# Patient Record
Sex: Male | Born: 1980 | Race: Black or African American | Hispanic: No | Marital: Married
Health system: Southern US, Community
[De-identification: ages and names within clinical notes are randomized; demographics above are authoritative.]

## PROBLEM LIST (undated history)

## (undated) DIAGNOSIS — F4310 Post-traumatic stress disorder, unspecified: Secondary | ICD-10-CM

---

## 2009-01-08 ENCOUNTER — Emergency Department (HOSPITAL_COMMUNITY): Admission: EM | Admit: 2009-01-08 | Discharge: 2009-01-08 | Payer: Self-pay | Admitting: Emergency Medicine

## 2010-05-07 IMAGING — CR DG FOOT COMPLETE 3+V*L*
3 series · 3 of 3 positions shown · non-contrast
Comparison: None

CLINICAL DATA: Left rib injury.  Question foreign body.

LEFT FOOT - COMPLETE 3+ VIEW

[t foot ap left]
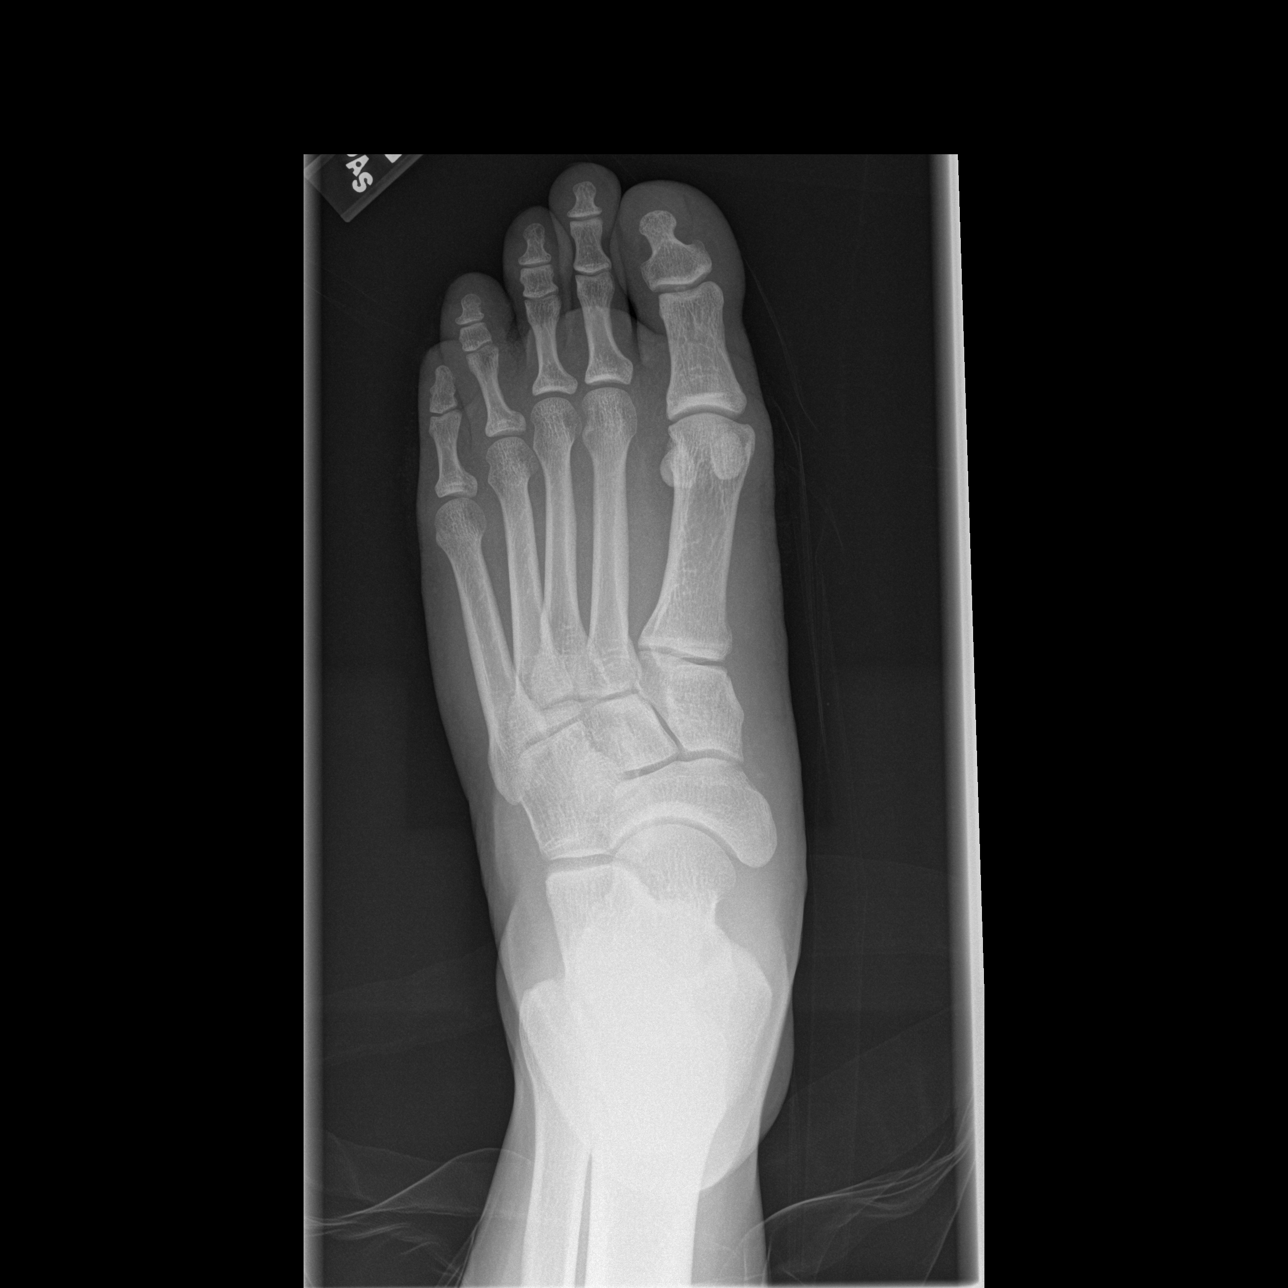

[t foot oblique left]
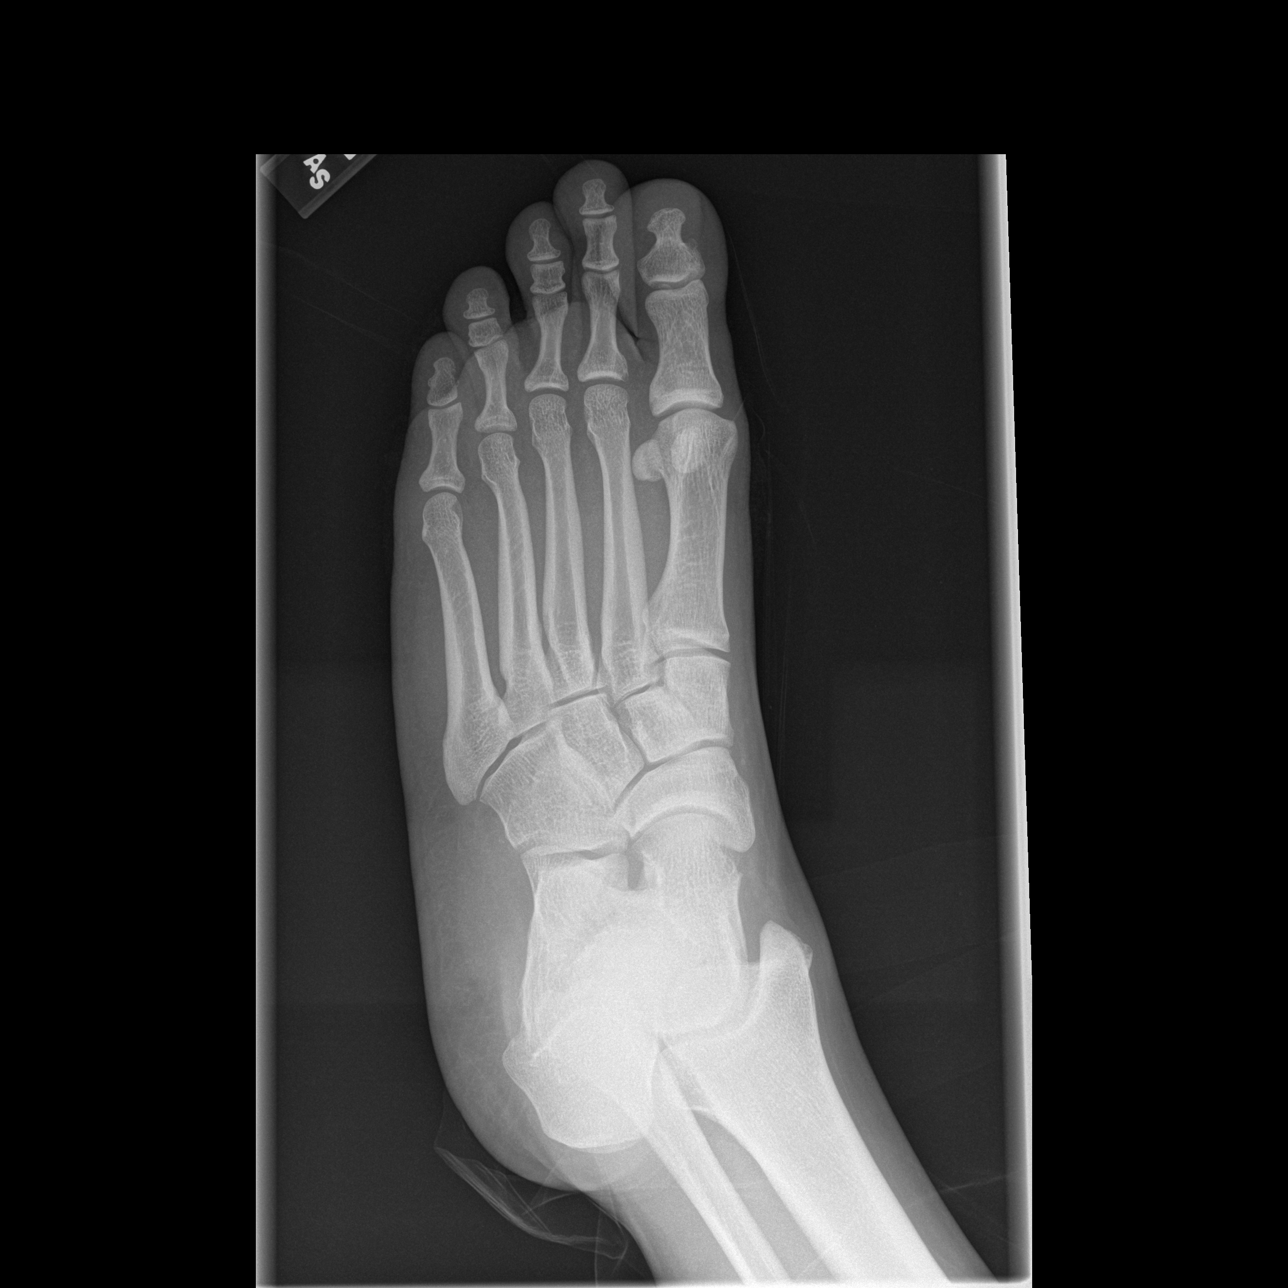

[t foot lat left]
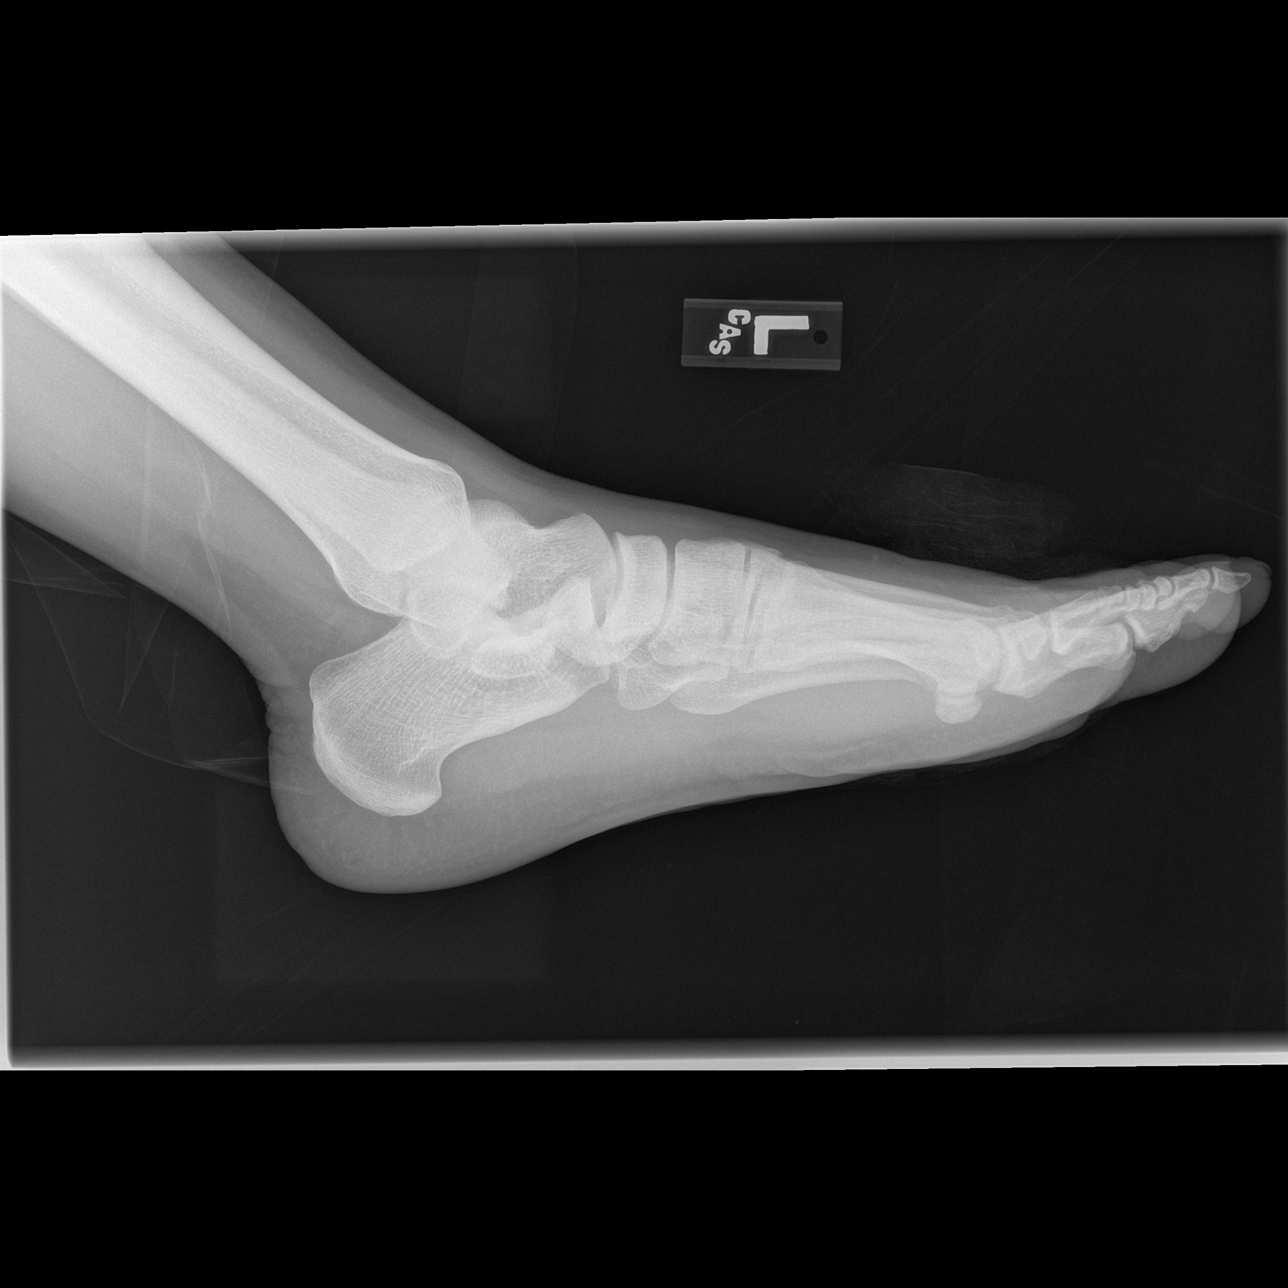

[3 of 3 positions shown; findings below may reference images not displayed]

FINDINGS: No radiopaque foreign body or soft tissue gas.  No
fracture.
IMPRESSION: Negative for fracture or radiopaque foreign body.

REF:G1 DICTATED: 01/08/2009 [DATE]

## 2016-04-10 ENCOUNTER — Inpatient Hospital Stay (HOSPITAL_COMMUNITY)
Admission: AD | Admit: 2016-04-10 | Discharge: 2016-04-15 | DRG: 885 | Disposition: A | Discharge status: Home / Self Care | Payer: No Typology Code available for payment source | Source: Intra-hospital | Attending: Psychiatry | Admitting: Psychiatry

## 2016-04-10 ENCOUNTER — Emergency Department (HOSPITAL_COMMUNITY)
Admission: EM | Admit: 2016-04-10 | Discharge: 2016-04-10 | Disposition: A | Discharge status: Home / Self Care | Payer: Non-veteran care | Attending: Emergency Medicine | Admitting: Emergency Medicine

## 2016-04-10 ENCOUNTER — Encounter (HOSPITAL_COMMUNITY): Payer: Self-pay | Admitting: Emergency Medicine

## 2016-04-10 ENCOUNTER — Encounter (HOSPITAL_COMMUNITY): Payer: Self-pay

## 2016-04-10 DIAGNOSIS — Z915 Personal history of self-harm: Secondary | ICD-10-CM

## 2016-04-10 DIAGNOSIS — Z9119 Patient's noncompliance with other medical treatment and regimen: Secondary | ICD-10-CM | POA: Diagnosis not present

## 2016-04-10 DIAGNOSIS — F17200 Nicotine dependence, unspecified, uncomplicated: Secondary | ICD-10-CM | POA: Diagnosis present

## 2016-04-10 DIAGNOSIS — Z791 Long term (current) use of non-steroidal anti-inflammatories (NSAID): Secondary | ICD-10-CM | POA: Diagnosis not present

## 2016-04-10 DIAGNOSIS — F4312 Post-traumatic stress disorder, chronic: Secondary | ICD-10-CM | POA: Diagnosis not present

## 2016-04-10 DIAGNOSIS — Z79899 Other long term (current) drug therapy: Secondary | ICD-10-CM | POA: Diagnosis not present

## 2016-04-10 DIAGNOSIS — G4733 Obstructive sleep apnea (adult) (pediatric): Secondary | ICD-10-CM | POA: Diagnosis not present

## 2016-04-10 DIAGNOSIS — F1220 Cannabis dependence, uncomplicated: Secondary | ICD-10-CM | POA: Insufficient documentation

## 2016-04-10 DIAGNOSIS — F4310 Post-traumatic stress disorder, unspecified: Secondary | ICD-10-CM | POA: Diagnosis not present

## 2016-04-10 DIAGNOSIS — F250 Schizoaffective disorder, bipolar type: Secondary | ICD-10-CM | POA: Diagnosis present

## 2016-04-10 DIAGNOSIS — R45851 Suicidal ideations: Secondary | ICD-10-CM | POA: Diagnosis present

## 2016-04-10 DIAGNOSIS — F332 Major depressive disorder, recurrent severe without psychotic features: Secondary | ICD-10-CM | POA: Diagnosis present

## 2016-04-10 DIAGNOSIS — F12259 Cannabis dependence with psychotic disorder, unspecified: Secondary | ICD-10-CM | POA: Diagnosis present

## 2016-04-10 DIAGNOSIS — F333 Major depressive disorder, recurrent, severe with psychotic symptoms: Secondary | ICD-10-CM | POA: Diagnosis present

## 2016-04-10 DIAGNOSIS — R45850 Homicidal ideations: Secondary | ICD-10-CM | POA: Diagnosis present

## 2016-04-10 HISTORY — DX: Post-traumatic stress disorder, unspecified: F43.10

## 2016-04-10 LAB — COMPREHENSIVE METABOLIC PANEL
ALBUMIN: 4.2 g/dL (ref 3.5–5.0)
ALT: 22 U/L (ref 17–63)
AST: 21 U/L (ref 15–41)
Alkaline Phosphatase: 67 U/L (ref 38–126)
Anion gap: 7 (ref 5–15)
BUN: 13 mg/dL (ref 6–20)
CHLORIDE: 106 mmol/L (ref 101–111)
CO2: 26 mmol/L (ref 22–32)
CREATININE: 0.92 mg/dL (ref 0.61–1.24)
Calcium: 9.3 mg/dL (ref 8.9–10.3)
GFR calc Af Amer: >60 mL/min (ref >60)
GLUCOSE: 109 mg/dL — AB (ref 65–99)
Potassium: 4 mmol/L (ref 3.5–5.1)
SODIUM: 139 mmol/L (ref 135–145)
Total Bilirubin: 0.2 mg/dL — ABNORMAL LOW (ref 0.3–1.2)
Total Protein: 7.8 g/dL (ref 6.5–8.1)

## 2016-04-10 LAB — CBC
HEMATOCRIT: 44.8 % (ref 39.0–52.0)
HEMOGLOBIN: 15.3 g/dL (ref 13.0–17.0)
MCH: 31.9 pg (ref 26.0–34.0)
MCHC: 34.2 g/dL (ref 30.0–36.0)
MCV: 93.5 fL (ref 78.0–100.0)
Platelets: 209 10*3/uL (ref 150–400)
RBC: 4.79 MIL/uL (ref 4.22–5.81)
RDW: 14 % (ref 11.5–15.5)
WBC: 6.7 10*3/uL (ref 4.0–10.5)

## 2016-04-10 LAB — ETHANOL: Alcohol, Ethyl (B): <5 mg/dL (ref <5)

## 2016-04-10 LAB — RAPID URINE DRUG SCREEN, HOSP PERFORMED
AMPHETAMINES: NOT DETECTED
BENZODIAZEPINES: NOT DETECTED
Barbiturates: NOT DETECTED
Cocaine: NOT DETECTED
Opiates: NOT DETECTED
TETRAHYDROCANNABINOL: POSITIVE — AB

## 2016-04-10 LAB — ACETAMINOPHEN LEVEL: Acetaminophen (Tylenol), Serum: <10 ug/mL — ABNORMAL LOW (ref 10–30)

## 2016-04-10 LAB — SALICYLATE LEVEL: Salicylate Lvl: <4 mg/dL (ref 2.8–30.0)

## 2016-04-10 MED ORDER — NICOTINE 21 MG/24HR TD PT24
21.0000 mg | MEDICATED_PATCH | Freq: Every day | TRANSDERMAL | Status: DC
  Administered 2016-04-11 – 2016-04-13 (×3): 21 mg via TRANSDERMAL
  Filled 2016-04-10 (×7): qty 1

## 2016-04-10 MED ORDER — LORAZEPAM 1 MG PO TABS: 0.0000 mg | ORAL_TABLET | Freq: Two times a day (BID) | ORAL | Status: DC

## 2016-04-10 MED ORDER — ALUM & MAG HYDROXIDE-SIMETH 200-200-20 MG/5ML PO SUSP: 30.0000 mL | ORAL | Status: DC | PRN

## 2016-04-10 MED ORDER — ACETAMINOPHEN 325 MG PO TABS: 650.0000 mg | ORAL_TABLET | ORAL | Status: DC | PRN

## 2016-04-10 MED ORDER — CLONIDINE HCL 0.1 MG PO TABS
0.1000 mg | ORAL_TABLET | Freq: Three times a day (TID) | ORAL | Status: DC | PRN
  Administered 2016-04-10 – 2016-04-14 (×5): 0.1 mg via ORAL
  Filled 2016-04-10 (×6): qty 1

## 2016-04-10 MED ORDER — MAGNESIUM HYDROXIDE 400 MG/5ML PO SUSP: 30.0000 mL | Freq: Every day | ORAL | Status: DC | PRN

## 2016-04-10 MED ORDER — LORAZEPAM 1 MG PO TABS
0.0000 mg | ORAL_TABLET | Freq: Four times a day (QID) | ORAL | Status: DC
  Administered 2016-04-10 (×2): 1 mg via ORAL
  Filled 2016-04-10 (×2): qty 1

## 2016-04-10 MED ORDER — SERTRALINE HCL 100 MG PO TABS: 100.0000 mg | ORAL_TABLET | Freq: Every day | ORAL | Status: DC | PRN

## 2016-04-10 MED ORDER — ALUM & MAG HYDROXIDE-SIMETH 200-200-20 MG/5ML PO SUSP
30.0000 mL | ORAL | Status: DC | PRN
  Administered 2016-04-15: 30 mL via ORAL
  Filled 2016-04-10: qty 30

## 2016-04-10 MED ORDER — ACETAMINOPHEN 325 MG PO TABS: 650.0000 mg | ORAL_TABLET | Freq: Four times a day (QID) | ORAL | Status: DC | PRN

## 2016-04-10 MED ORDER — ONDANSETRON HCL 4 MG PO TABS: 4.0000 mg | ORAL_TABLET | Freq: Three times a day (TID) | ORAL | Status: DC | PRN

## 2016-04-10 MED ORDER — NICOTINE 21 MG/24HR TD PT24
21.0000 mg | MEDICATED_PATCH | Freq: Every day | TRANSDERMAL | Status: DC
  Administered 2016-04-10: 21 mg via TRANSDERMAL
  Filled 2016-04-10: qty 1

## 2016-04-10 MED ORDER — ZOLPIDEM TARTRATE 5 MG PO TABS: 5.0000 mg | ORAL_TABLET | Freq: Every evening | ORAL | Status: DC | PRN

## 2016-04-10 MED ORDER — SERTRALINE HCL 50 MG PO TABS: 100.0000 mg | ORAL_TABLET | Freq: Every day | ORAL | Status: DC | PRN

## 2016-04-10 NOTE — Tx Team (Signed)
Initial Interdisciplinary Treatment Plan   PATIENT STRESSORS: Medication change or noncompliance Traumatic event   PATIENT STRENGTHS: Capable of independent living Communication skills Supportive family/friends   PROBLEM LIST: Problem List/Patient Goals Date to be addressed Date deferred Reason deferred Estimated date of resolution  Depression 04/10/16     Psychosis 04/10/16     Suicidal ideation 04/10/16     "Better understanding of myself" 04/10/16     "To get out of here" 04/10/16                              DISCHARGE CRITERIA:  Improved stabilization in mood, thinking, and/or behavior Verbal commitment to aftercare and medication compliance  PRELIMINARY DISCHARGE PLAN: Outpatient therapy Medication management  PATIENT/FAMIILY INVOLVEMENT: This treatment plan has been presented to and reviewed with the patient, Robert Kelly.  The patient and family have been given the opportunity to ask questions and make suggestions.  Norm ParcelHeather V Rodrigus Kilker 04/10/2016, 4:09 PM

## 2016-04-10 NOTE — ED Notes (Signed)
Pt admitted to the [facility] with reports of auditory and visual hallucinations. Voices tell patient to hurt himself and others. He sees himself stabbing and dismembering with vivid details. Pt is a veteran with the [employer] and has a hx of PTSD, bipolar, anxiety and depression. He sees himself hanging and walking towards an edge. He says that he drinks and uses marijuana occasionally. Pt has a medical hx of sleep apnea and uses a CPAP at night. Pt contracts for safety.

## 2016-04-10 NOTE — Progress Notes (Signed)
Robert Kelly is a 35 year old male being admitted voluntarily to [facility] from [facility].  [facility].  He reports vivid visions of hanging himself and walking towards the edge of a cliff.  He is unable to contract for safety.  He has history of multiple suicide attempts in the past.  He reports hopelessness, guilt and anhedonia.  He admits to hearing voices that tell him to harm other people.  He denies drug and alcohol abuse.  He has PTSD, Bipolar, Anxiety, Depression and sleep apnea.  Admission paperwork completed and signed.  Belongings searched and secured in locker # 41(black cell phone).  Skin assessment completed and noted tattoos R/L shoulder and R/L forearm.  Q 15 minute checks initiated for safety.  We will monitor the progress towards his goals.

## 2016-04-10 NOTE — BH Assessment (Signed)
BHH Assessment Progress Note   Per Thedore MinsMojeed Akintayo, MD, this pt requires psychiatric hospitalization at this time.  At 09:47 this Clinical research associatewriter [facility] and spoke to VarnaJoey.  He reports that they do not have any beds available at this time, but will place pt on their wait list.  Berneice Heinrichina Tate, RN, [facility] has [facility] to Rm 503[facility]2.  Pt has signed Voluntary Admission and Consent for Treatment, as well as Consent to Release Information to his mother, his wife, and his representatives at the [facility] transitional housing team, and signed [facility].  Pt's nurse, Jan, has been notified, and agrees to send original paperwork along with pt via [facility], and to call report to [phone].  Doylene Canninghomas Analena Gama, MA Triage Specialist [phone]

## 2016-04-10 NOTE — ED Provider Notes (Signed)
CSN: [id number]     Arrival date & time 04/10/16  [id number] History   First MD Initiated Contact with Patient 04/10/16 [phone]     Chief Complaint  Patient presents with  . Suicidal      HPI  Expand All Collapse All   Pt reports SI and HI that began this am. Thoughts worsened by PTSD. No specific plan. Used marijuana and etoh yesterday        Past Medical History  Diagnosis Date  . PTSD (post-traumatic stress disorder)    History reviewed. No pertinent past surgical history. Family History  Problem Relation Age of Onset  . Diabetes Mother   . Alcohol abuse Father   . Alcohol abuse Paternal Grandfather    Social History  Substance Use Topics  . Smoking status: Current Every Day Smoker  . Smokeless tobacco: None  . Alcohol Use: Yes    Review of Systems  Neurological: Negative for headaches.  Psychiatric/Behavioral: Positive for suicidal ideas and agitation.  All other systems reviewed and are negative.     Allergies  Shellfish allergy  Home Medications   Prior to Admission medications   Medication Sig Start Date End Date Taking? Authorizing Provider  meloxicam (MOBIC) 15 MG tablet Take 15 mg by mouth daily.   Yes Historical Provider, MD  sertraline (ZOLOFT) 100 MG tablet Take 100 mg by mouth daily as needed (for depression).   Yes Historical Provider, MD   BP 155/98 mmHg  Pulse 63  Temp(Src) 98.1 F (36.7 C) (Oral)  Resp 16  Ht 5\' 9"  (1.753 m)  Wt 280 lb (127.007 kg)  BMI 41.33 kg/m2  SpO2 99% Physical Exam  Constitutional: He is oriented to person, place, and time. He appears well-developed and well-nourished. No distress.  HENT:  Head: Normocephalic and atraumatic.  Eyes: Pupils are equal, round, and reactive to light.  Neck: Normal range of motion.  Cardiovascular: Normal rate and intact distal pulses.   Pulmonary/Chest: No respiratory distress.  Abdominal: Normal appearance. He exhibits no distension.  Musculoskeletal: Normal range of motion.   Neurological: He is alert and oriented to person, place, and time. No cranial nerve deficit.  Skin: Skin is warm and dry. No rash noted.  Psychiatric: He has a normal mood and affect. His behavior is normal. He expresses homicidal and suicidal ideation. He expresses no suicidal plans.  Nursing note and vitals reviewed.   ED Course  Procedures (including critical care time) Labs Review Labs Reviewed  COMPREHENSIVE METABOLIC PANEL - Abnormal; Notable for the following:    Glucose, Bld 109 (*)    Total Bilirubin 0.2 (*)    All other components within normal limits  ACETAMINOPHEN LEVEL - Abnormal; Notable for the following:    Acetaminophen (Tylenol), Serum <10 (*)    All other components within normal limits  URINE RAPID DRUG SCREEN, HOSP PERFORMED - Abnormal; Notable for the following:    Tetrahydrocannabinol POSITIVE (*)    All other components within normal limits  ETHANOL  SALICYLATE LEVEL  CBC    Imaging Review No results found. I have personally reviewed and evaluated these images and lab results as part of my medical decision-making.   EKG Interpretation None      MDM   Final diagnoses:  Schizoaffective disorder, bipolar type (HCC)  Cannabis use disorder, severe, dependence (HCC)        Nelva Nayobert Alfonsa Vaile, MD 04/15/16 [phone]

## 2016-04-10 NOTE — BH Assessment (Addendum)
Assessment Note  Robert Kelly is an 35 y.o. male with history of PTSD. Patient presents to [facility] with [facility]. Patient is however voluntary. He has complaints of suicidal ideations increasing over the past month. He has vivid visions of hanging self and walking toward the edge of a cliff. Patient is unable to contract for safety. He has a history multiple prior suicide attempts (examples: hanging self and jumping off a bridge). No self mutilating behaviors. Patient suffers from depression and anxiety. He has a history of depression with symptoms of hopelessness, guilt, and loss of interest usual pleasures. Patient's affect is flat. Mood is depressed. Patient is oriented to person, place, time, and situation.   Patient has homicidal thoughts triggered by [facility]. Patient hears voices telling him to, "Hurt people". Patient sts, "I've been hearing voices all my life". Patient has a wife and 8 children, 6 children live with him. Patient requesting hospitalization stating, "I don't feel safe hearing the voices and having my kids at home with me". He denies history of violence or asaultive behaviors. He has current legal charges for child support and obtaining property under false pretense (court dates 04/12/2016 & 05/10/2016).    Patient denies alcohol and drug use. Patient's UDS was however positive for THC. BAL is negative. Patient denies history of INPT mental health treatment. He does report seeking outpatient mental health treatment at the [facility].    Diagnosis: Major Depressive Disorder, Recurrent, Severe, with psychotic features, PTSD, Cannabis Abuse  Past Medical History:  Past Medical History  Diagnosis Date  . PTSD (post-traumatic stress disorder)     History reviewed. No pertinent past surgical history.  Family History: History reviewed. No pertinent family history.  Social History:  reports that he has been smoking.  He does not have any smokeless tobacco history on file. He  reports that he drinks alcohol. He reports that he uses illicit drugs (Marijuana).  Additional Social History:  Alcohol / Drug Use Pain Medications: SEE MAR Prescriptions: SEE MAR Over the Counter: SEE MAR History of alcohol / drug use?: Yes (THC) Substance #1 Name of Substance 1: Patient denied alcohol an drug use; UDS positive for THC 1 - Age of First Use: n/a 1 - Amount (size/oz): n/a 1 - Frequency: n/a 1 - Duration: n/a 1 - Last Use / Amount: n/a  CIWA: CIWA-Ar BP: (!) 164/106 mmHg Pulse Rate: 69 Nausea and Vomiting: no nausea and no vomiting Tactile Disturbances: none Tremor: no tremor Auditory Disturbances: mild harshness or ability to frighten Paroxysmal Sweats: no sweat visible Visual Disturbances: very mild sensitivity Anxiety: three Headache, Fullness in Head: none present Agitation: normal activity Orientation and Clouding of Sensorium: oriented and can do serial additions CIWA-Ar Total: 6 COWS:    Allergies:  Allergies  Allergen Reactions  . Shellfish Allergy Anaphylaxis    Home Medications:  (Not in a hospital admission)  OB/GYN Status:  No LMP for male patient.  General Assessment Data [facility] TTS Assessment: In system Is this a Tele or Face-to-Face Assessment?: Face-to-Face Is this an Initial Assessment or a Re-assessment for this encounter?: Initial Assessment Marital status: Married [name] name:  (n/a) Is patient pregnant?: No Pregnancy Status: No Living Arrangements: Spouse/significant other, Children Can pt return to current living arrangement?: Yes Admission Status: Voluntary Referral Source: Self/Family/Friend Insurance type:  (Self Pay )     Crisis Care Plan Living Arrangements: Spouse/significant other, Children Legal Guardian: Other: (no legal guardian ) [facility]  [facility] Name of Therapist:  (n/a)  Education Status Is patient currently in school?: No Current Grade:  (n/a) Highest  grade of school patient has completed:  (some college) Name of school:  (n/a) Contact person:  (n/a)  Risk to self with the past 6 months Suicidal Ideation: Yes-Currently Present Has patient been a risk to self within the past 6 months prior to admission? : Yes Suicidal Intent: Yes-Currently Present Has patient had any suicidal intent within the past 6 months prior to admission? : Yes Is patient at risk for suicide?: Yes Suicidal Plan?: Yes-Currently Present Has patient had any suicidal plan within the past 6 months prior to admission? : Yes Specify Current Suicidal Plan:  (hang self and walk toward the edge of cliff) Access to Means: Yes Specify Access to Suicidal Means:  (ability to hang self ) What has been your use of drugs/alcohol within the last 12 months?:  (THC) Previous Attempts/Gestures: Yes How many times?:  (1-2'x- walk to the edge of cliff and hang self ) Other Self Harm Risks:  (denies ) Triggers for Past Attempts: Other (Comment) (psychosis ) Intentional Self Injurious Behavior: None Family Suicide History: Unknown Recent stressful life event(s): Other (Comment) ("Maintaining a household") Persecutory voices/beliefs?: No Depression: Yes Depression Symptoms: Feeling angry/irritable, Loss of interest in usual pleasures, Guilt, Feeling worthless/self pity, Fatigue, Isolating, Tearfulness, Insomnia, Despondent Substance abuse history and/or treatment for substance abuse?: No Suicide prevention information given to non-admitted patients: Not applicable  Risk to Others within the past 6 months Homicidal Ideation: Yes-Currently Present Does patient have any lifetime risk of violence toward others beyond the six months prior to admission? : Yes (comment) Thoughts of Harm to Others: Yes-Currently Present Comment - Thoughts of Harm to Others:  ("I hear voices all the time that give me urges to harm other) Current Homicidal Intent: No Current Homicidal Plan: No Access to  Homicidal Means: No Identified Victim:  ("Anyone") History of harm to others?: No Assessment of Violence: None Noted Violent Behavior Description:  (patient is calm and cooperative) Does patient have access to weapons?: No Criminal Charges Pending?: Yes Describe Pending Criminal Charges:  (Obtaining property under fasle protense & Child support ) Does patient have a court date: Yes Court Date:  ("This Friday" and May 07, 2016) Is patient on probation?: No  Psychosis Hallucinations: Auditory (Auditory-"hurt people"; Visual-"hanging self") Delusions: Unspecified  Mental Status Report Appearance/Hygiene: In scrubs Eye Contact: Good Motor Activity: Freedom of movement Speech: Logical/coherent Level of Consciousness: Alert Mood: Depressed Affect: Appropriate to circumstance Anxiety Level: Severe Thought Processes: Relevant Judgement: Impaired Orientation: Person, Place, Time, Situation Obsessive Compulsive Thoughts/Behaviors: None  Cognitive Functioning Concentration: Decreased Memory: Recent Intact, Remote Intact IQ: Average Insight: Poor Impulse Control: Poor Appetite: Fair Weight Loss:  (n/a) Weight Gain:  (n/a) Sleep: Decreased Total Hours of Sleep:  (varies ) Vegetative Symptoms: None  ADLScreening [facility](BHH Assessment Services) Patient's cognitive ability adequate to safely complete daily activities?: Yes Patient able to express need for assistance with ADLs?: Yes Independently performs ADLs?: Yes (appropriate for developmental age)  Prior Inpatient Therapy Prior Inpatient Therapy: No Prior Therapy Dates:  (n/a) Prior Therapy Facilty/Provider(s):  (n/a) Reason for Treatment:  (n/a)  Prior Outpatient Therapy Prior Outpatient Therapy: Yes Prior Therapy Dates:  (current) Prior Therapy [facility]ilty[facility]Provider[facility]  ([facility]) Reason for Treatment:  (psychosis ) Does patient have an ACCT team?: No Does patient have Intensive In-House Services?  : No Does patient have  [facility] services? : No Does patient have P4CC  services?: No  ADL Screening (condition at time of admission) Patient's cognitive ability adequate to safely complete daily activities?: Yes Is the patient deaf or have difficulty hearing?: No Does the patient have difficulty seeing, even when wearing glasses/contacts?: No Does the patient have difficulty concentrating, remembering, or making decisions?: No Patient able to express need for assistance with ADLs?: Yes Does the patient have difficulty dressing or bathing?: No Independently performs ADLs?: Yes (appropriate for developmental age) Does the patient have difficulty walking or climbing stairs?: No Weakness of Arms/Hands: None  Home Assistive Devices/Equipment Home Assistive Devices/Equipment: None    Abuse/Neglect Assessment (Assessment to be complete while patient is alone) Physical Abuse: Denies Verbal Abuse: Denies Sexual Abuse: Denies Exploitation of patient/patient's resources: Denies Self-Neglect: Denies Values / Beliefs Cultural Requests During Hospitalization: None Spiritual Requests During Hospitalization: None   Advance Directives (For Healthcare) Does patient have an advance directive?: No Would patient like information on creating an advanced directive?: No - patient declined information Nutrition Screen- [facility] Patient's home diet: Regular  Additional Information 1:1 In Past 12 Months?: No CIRT Risk: No Elopement Risk: No Does patient have medical clearance?: Yes     Disposition:  Disposition Initial Assessment Completed for this Encounter: Yes Disposition of Patient: Inpatient treatment program (Per Dr. Jannifer Franklin and Julieanne Cotton, NP patient meets criteria) Type of inpatient treatment program: Adult Dr. Jannifer Franklin and Julieanne Cotton, NP recommends INPT treatment. TTS to seek placement.   On Site Evaluation by:   Reviewed with Physician:    Melynda Ripple [facility] 04/10/2016 9:51 AM

## 2016-04-10 NOTE — ED Notes (Signed)
Pt reports SI and HI that began this am. Thoughts worsened by PTSD. No specific plan. Used marijuana and etoh yesterday.

## 2016-04-10 NOTE — ED Notes (Signed)
Called [facility] and requested transport

## 2016-04-11 ENCOUNTER — Encounter (HOSPITAL_COMMUNITY): Payer: Self-pay | Admitting: Psychiatry

## 2016-04-11 DIAGNOSIS — F250 Schizoaffective disorder, bipolar type: Principal | ICD-10-CM

## 2016-04-11 DIAGNOSIS — R45851 Suicidal ideations: Secondary | ICD-10-CM

## 2016-04-11 DIAGNOSIS — G4733 Obstructive sleep apnea (adult) (pediatric): Secondary | ICD-10-CM

## 2016-04-11 DIAGNOSIS — F4312 Post-traumatic stress disorder, chronic: Secondary | ICD-10-CM

## 2016-04-11 DIAGNOSIS — F1220 Cannabis dependence, uncomplicated: Secondary | ICD-10-CM | POA: Diagnosis present

## 2016-04-11 MED ORDER — BENZTROPINE MESYLATE 0.5 MG PO TABS
0.5000 mg | ORAL_TABLET | Freq: Two times a day (BID) | ORAL | Status: DC
  Administered 2016-04-11 – 2016-04-15 (×8): 0.5 mg via ORAL
  Filled 2016-04-11: qty 1
  Filled 2016-04-11: qty 14
  Filled 2016-04-11 (×8): qty 1
  Filled 2016-04-11: qty 14
  Filled 2016-04-11: qty 1

## 2016-04-11 MED ORDER — CITALOPRAM HYDROBROMIDE 10 MG PO TABS
10.0000 mg | ORAL_TABLET | Freq: Every day | ORAL | Status: DC
  Administered 2016-04-11 – 2016-04-15 (×5): 10 mg via ORAL
  Filled 2016-04-11 (×4): qty 1
  Filled 2016-04-11: qty 10
  Filled 2016-04-11 (×2): qty 1

## 2016-04-11 MED ORDER — HALOPERIDOL 5 MG PO TABS
5.0000 mg | ORAL_TABLET | Freq: Two times a day (BID) | ORAL | Status: DC
  Administered 2016-04-11 – 2016-04-15 (×8): 5 mg via ORAL
  Filled 2016-04-11 (×5): qty 1
  Filled 2016-04-11: qty 20
  Filled 2016-04-11 (×5): qty 1
  Filled 2016-04-11: qty 20

## 2016-04-11 MED ORDER — TRAZODONE HCL 50 MG PO TABS
50.0000 mg | ORAL_TABLET | Freq: Every day | ORAL | Status: DC
  Administered 2016-04-11 – 2016-04-14 (×4): 50 mg via ORAL
  Filled 2016-04-11: qty 1
  Filled 2016-04-11: qty 10
  Filled 2016-04-11 (×4): qty 1

## 2016-04-11 MED ORDER — DIVALPROEX SODIUM ER 250 MG PO TB24
750.0000 mg | ORAL_TABLET | Freq: Every day | ORAL | Status: DC
  Administered 2016-04-11 – 2016-04-14 (×4): 750 mg via ORAL
  Filled 2016-04-11: qty 30
  Filled 2016-04-11 (×5): qty 3
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
## 2016-04-11 NOTE — Progress Notes (Signed)
D: Patient denies SI or HI during assessment, does endorse auditory hallucinations.  Pt attended evening group and brought clothes to be washed.  He is otherwise calm and cooperative with staff and others on the unit.  A: Patient given emotional support from RN. Patient encouraged to come to staff with concerns and/or questions. Patient's medication routine continued. Patient's orders and plan of care reviewed.   R: Patient remains appropriate and cooperative. Will continue to monitor patient q15 minutes for safety.
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
## 2016-04-11 NOTE — [facility] Group Notes (Signed)
[facility] Group Notes:  (Counselor/Nursing/MHT/Case Management/Adjunct)  04/11/2016 1:15PM  Type of Therapy:  Group Therapy  Participation Level:  Active  Participation Quality:  Appropriate  Affect:  Flat  Cognitive:  Oriented  Insight:  Improving  Engagement in Group:  Limited  Engagement in Therapy:  Limited  Modes of Intervention:  Discussion, Exploration and Socialization  Summary of Progress/Problems: The topic for group was balance in life.  Pt participated in the discussion about when their life was in balance and out of balance and how this feels.  Pt discussed ways to get back in balance and short term goals they can work on to get where they want to be.  Stayed the entire time.  Engaged throughout.  Concrete.  When talking about glass half full v half empty, stated "if it's half empty, I just fill it up."  Re: balance, states he is currently unbalanced "because I have questions that I can't find the answers to right now; I'm having a hard time finding direction."  States his support system of wife, kids and pastor help him find balance.   Daryel Geraldorth, Amorina Doerr B 04/11/2016 2:29 PM

## 2016-04-11 NOTE — [facility] Group Notes (Signed)
[facility] Group Notes:  (Nursing/MHT/Case Management/Adjunct)  Date:  04/11/2016  Time:  11:38 AM  Type of Therapy:  Nurse Education  Participation Level:  Active  Participation Quality:  Appropriate and Attentive  Affect:  Appropriate  Cognitive:  Alert and Appropriate  Insight:  Appropriate and Good  Engagement in Group:  Engaged  Modes of Intervention:  Activity, Discussion and Education  Summary of Progress/Problems: Topic was on leisure and lifestyle changes. Discussed the importance of choosing a healthy leisure activities. Group encouraged to surround themselves with positive and healthy group/support system when changing to a healthy lifestyle. Patient was receptive and contributed.    Mickie Baillizabeth O Iwenekha 04/11/2016, 11:38 AM

## 2016-04-11 NOTE — [facility] Group Notes (Signed)
Pt did not attend karaoke group.  Dhana Totton, MHT  

## 2016-04-11 NOTE — Tx Team (Signed)
Interdisciplinary Treatment Plan Update (Adult)  Date:  04/11/2016   Time Reviewed:  10:54 AM   Progress in Treatment: Attending groups: Yes. Participating in groups:  Yes. Taking medication as prescribed:  Yes. Tolerating medication:  Yes. Family/Significant other contact made:  No Patient understands diagnosis:  Yes  As evidenced by seeking help with voices and depression Discussing patient identified problems/goals with staff:  Yes, see initial care plan. Medical problems stabilized or resolved:  Yes. Denies suicidal/homicidal ideation: Yes. Issues/concerns per patient self-inventory:  No. Other:  New problem(s) identified:  Discharge Plan or Barriers: see below  Reason for Continuation of Hospitalization: Anxiety Depression Hallucinations Medication stabilization Other; describe Symptoms related to PTSD  Comments: Pt today reports mood lability, sleep issues , SI and psychosis. Patient will benefit from inpatient treatment and stabilization.  Estimated length of stay is 5-7 days.  Reviewed past medical records,treatment plan.  Will start a trial of Haldol 5 mg po bid for psychosis/mood sx. Will add Cogentin 0.5 mg po bid for EPS. Will start Celexa 10 mg po daily for PTSD sx. Will add Depakote ER 750 mg po qhs for mood lability. Depakote level in 5 days. Will make available PRN medications as per agitation protocol.  Estimated length of stay: 4-5 days  New goal(s):  Review of initial/current patient goals per problem list:   Review of initial/current patient goals per problem list:  1. Goal(s): Patient will participate in aftercare plan   Met: Yes   Target date: 3-5 days post admission date   As evidenced by: Patient will participate within aftercare plan AEB aftercare provider and housing plan at discharge being identified. 04/11/16:  Return home, follow up outpt   2. Goal (s): Patient will exhibit decreased depressive symptoms and suicidal  ideations.   Met: No   Target date: 3-5 days post admission date   As evidenced by: Patient will utilize self rating of depression at 3 or below and demonstrate decreased signs of depression or be deemed stable for discharge by MD. 04/11/16:  Rates his depression a 7 today      5. Goal(s): Patient will demonstrate decreased signs of psychosis  * Met: No  * Target date: 3-5 days post admission date  * As evidenced by: Patient will demonstrate decreased frequency of AVH or return to baseline function 04/11/16:  States he has been hearing voices telling him to hurt others   6. Goal (s): Patient will demonstrate decreased signs of mania  * Met: No  * Target date: 3-5 days post admission date  * As evidenced by: Patient demonstrate decreased signs of mania AEB decreased mood instability and return to baseline functioning 04/11/16:  Started on depakote today to address symptoms of irritability, anger, poor sleep, impulsivity.       Attendees: Patient:  04/11/2016 10:54 AM   Family:   04/11/2016 10:54 AM   Physician:  Ursula Alert, MD 04/11/2016 10:54 AM   Nursing:   Gaylan Gerold, RN 04/11/2016 10:54 AM   CSW:    Roque Lias, LCSW   04/11/2016 10:54 AM   Other:  04/11/2016 10:54 AM   Other:   04/11/2016 10:54 AM   Other:  Lars Pinks, Nurse CM 04/11/2016 10:54 AM   Other:   04/11/2016 10:54 AM   Other:  Norberto Sorenson, [place]  04/11/2016 10:54 AM   Other:  04/11/2016 10:54 AM   Other:  04/11/2016 10:54 AM   Other:  04/11/2016 10:54 AM   Other:  04/11/2016 10:54  AM   Other:  04/11/2016 10:54 AM   Other:   04/11/2016 10:54 AM    Scribe for Treatment Team:   Trish Mage, 04/11/2016 10:54 AM

## 2016-04-11 NOTE — Progress Notes (Signed)
DAR NOTE: Patient presents with flat affect and depressed mood.  Reports suicidal thoughts, auditory and visual hallucinations but contracts for safety.  Rates depression at 8, hopelessness at 6, and anxiety at 8.  Reports withdrawal symptoms of agitation and irritability on self inventory form.  Maintained on routine safety checks.  Medications given as prescribed.  Support and encouragement offered as needed.  Attended group and participated.  States goal for today is "believe that I can achieve the life goal."  Minimal interaction with staff and peers.  Clonidine 0.1 mg given for blood pressure result of 158/98.  MD made aware of result.

## 2016-04-12 LAB — LIPID PANEL
CHOL/HDL RATIO: 4.9 ratio
CHOLESTEROL: 196 mg/dL (ref 0–200)
HDL: 40 mg/dL — AB (ref >40)
LDL Cholesterol: 130 mg/dL — ABNORMAL HIGH (ref 0–99)
Triglycerides: 128 mg/dL (ref <150)
VLDL: 26 mg/dL (ref 0–40)

## 2016-04-12 LAB — TSH: TSH: 0.787 u[IU]/mL (ref 0.350–4.500)

## 2016-04-12 NOTE — [facility] Group Notes (Signed)
Adult Psychoeducational Group Note  Date:  04/12/2016 Time:  8:54 PM  Group Topic/Focus:  Wrap-Up Group:   The focus of this group is to help patients review their daily goal of treatment and discuss progress on daily workbooks.  Participation Level:  Minimal  Participation Quality:  Appropriate  Affect:  Flat  Cognitive:  Appropriate  Insight: Good  Engagement in Group:  Limited  Modes of Intervention:  Discussion  Additional Comments:  Pt rated his day a 8 because "I got good rest last night".  He had no goal or discharge plans.  Caroll RancherLindsay, Kaelen Caughlin A 04/12/2016, 8:54 PM

## 2016-04-12 NOTE — [facility] Suicide Risk Assessment (Signed)
[facility] INPATIENT:  Family/Significant Other Suicide Prevention Education  Suicide Prevention Education:  Education Completed; Cristino MartesKendall Jenkins, [phone], wife has been identified by the patient as the family member/significant other with whom the patient will be residing, and identified as the person(s) who will aid the patient in the event of a mental health crisis (suicidal ideations/suicide attempt).  With written consent from the patient, the family member/significant other has been provided the following suicide prevention education, prior to the and/or following the discharge of the patient.  The suicide prevention education provided includes the following:  Suicide risk factors  Suicide prevention and interventions  National Suicide Hotline telephone number  [facility] assessment telephone number  [facility] Assistance 911  [facility] and/or Residential Mobile Crisis [facility] telephone number  Request made of family/significant other to:  Remove weapons (e.g., guns, rifles, knives), all items previously/currently identified as safety concern.    Remove drugs/medications (over-the-counter, prescriptions, illicit drugs), all items previously/currently identified as a safety concern.  The family member/significant other verbalizes understanding of the suicide prevention education information provided.  The family member/significant other agrees to remove the items of safety concern listed above.   Daryel Geraldorth, Abbie Berling B 04/12/2016, 11:00 AM

## 2016-04-12 NOTE — [facility] Group Notes (Signed)
[facility]   04/12/2016 10:54 AM  Participation Quality:  Engaged  Mood/Affect:  Appropriate  Depression Rating:    Anxiety Rating:    Thoughts of Suicide:  No Will you contract for safety?   NA  Current AVH:  Yes  Plan for Discharge/Comments:  Mood good.  "I got the best sleep last night that I have had in 5 years."  Voices remain, but "they have toned down a lot."  Transportation Means:   Supports:  [place], Thereasa Distanceodney B

## 2016-04-12 NOTE — Progress Notes (Signed)
DAR NOTE: Patient presents with anxious affect and depressed mood.  Denies pain, auditory and visual hallucinations.  Rates depression at 5, hopelessness at 4, and anxiety at 0.  Maintained on routine safety checks.  Medications given as prescribed.  Support and encouragement offered as needed.  Attended group and participated.  States goal for today is "my faith."  Patient observed socializing with peers in the dayroom.  Offered no complaint.

## 2016-04-12 NOTE — Progress Notes (Signed)
D: Pt is isolative and withdrawn to room; in bed with eyes closed all evening. Pt denied depression, anxiety, pain, SI, HI or AVH; states, "I am doing fine; I'm doing just fine. At the time of assessment, Pt did not look to be in any acute distress.  A: Medications offered as prescribed.  Support, encouragement, and safe environment provided.  15-minute safety checks continue. R: Pt was med compliant.  Pt did not attend karaoke group. Safety checks continue.

## 2016-04-12 NOTE — Progress Notes (Signed)
[facility] MD Progress Note  04/12/2016 10:10 AM Robert Kelly  MRN:  [id number] Subjective: Pt states " I am still hearing voices - but they are not as loud.'  Objective:Robert Kelly is a 35 y.o. 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  Patient seen and chart reviewed.Discussed patient with treatment team.  Pt today seen as withdrawn, vaguely irritable , although improving. His AH is not as loud, good response to medications, denies ADRs. Will continue to encourage and support.      Principal Problem: Schizoaffective disorder, bipolar type (HCC) Diagnosis:   Patient Active Problem List   Diagnosis Date Noted  . Schizoaffective disorder, bipolar type (HCC) [F25.0] 04/11/2016  . Cannabis use disorder, severe, dependence (HCC) [F12.20] 04/11/2016  . Obstructive sleep apnea [G47.33] 04/11/2016  . Chronic post-traumatic stress disorder (PTSD) [F43.12] 04/10/2016   Total Time spent with patient: 30 minutes  Past Psychiatric History: as per H&P.  Past Medical History:  Past Medical History  Diagnosis Date  . PTSD (post-traumatic stress disorder)    History reviewed. No pertinent past surgical history. Family History:  Family History  Problem Relation Age of Onset  . Diabetes Mother   . Alcohol abuse Father   . Alcohol abuse Paternal Grandfather    Family Psychiatric  History: As per H&P. Social History:  History  Alcohol Use  . Yes     History  Drug Use  . Yes  . Special: Marijuana    Social History   Social History  . Marital Status: Divorced    Spouse Name: N/A  . Number of Children: N/A  . Years of Education: N/A   Social History Main Topics  . Smoking status: Current Every Day Smoker  . Smokeless tobacco: None  . Alcohol Use: Yes  . Drug Use: Yes    Special:  Marijuana  . Sexual Activity: Not Asked   Other Topics Concern  . None   Social History Narrative   Additional Social History:    Pain Medications: SEE MAR Prescriptions: SEE MAR Over the Counter: SEE MAR History of alcohol / drug use?: Yes Longest period of sobriety (when/how long): Unknown Negative Consequences of Use: Legal Withdrawal Symptoms: Other (Comment) (Done) Name of Substance 1: Patient denied alcohol an drug use; UDS positive for THC 1 - Age of First Use: n/a 1 - Amount (size/oz): n/a 1 - Frequency: n/a 1 - Duration: n/a 1 - Last Use / Amount: n/a                  Sleep: Fair  Appetite:  Fair  Current Medications: Current Facility-Administered Medications  Medication Dose Route Frequency Provider Last Rate Last Dose  . acetaminophen (TYLENOL) tablet 650 mg  650 mg Oral Q4H PRN Earney Navy, NP      . alum & mag hydroxide-simeth (MAALOX/MYLANTA) 200-200-20 MG/5ML suspension 30 mL  30 mL Oral PRN Earney Navy, NP      . benztropine (COGENTIN) tablet 0.5 mg  0.5 mg Oral BID Jomarie Longs, MD   0.5 mg at 04/12/16 0832  . citalopram (CELEXA) tablet 10 mg  10 mg Oral Daily Jomarie Longs, MD   10 mg at 04/12/16 0832  . cloNIDine (CATAPRES) tablet 0.1 mg  0.1  mg Oral Q8H PRN Beau Fanny, FNP   0.1 mg at 04/11/16 1157  . divalproex (DEPAKOTE ER) 24 hr tablet 750 mg  750 mg Oral QHS Admiral Marcucci, MD   750 mg at 04/11/16 2205  . haloperidol (HALDOL) tablet 5 mg  5 mg Oral BID Jomarie Longs, MD   5 mg at 04/12/16 0832  . magnesium hydroxide (MILK OF MAGNESIA) suspension 30 mL  30 mL Oral Daily PRN Earney Navy, NP      . nicotine (NICODERM CQ - dosed in mg/24 hours) patch 21 mg  21 mg Transdermal Daily Earney Navy, NP   21 mg at 04/12/16 0834  . ondansetron (ZOFRAN) tablet 4 mg  4 mg Oral Q8H PRN Earney Navy, NP      . traZODone (DESYREL) tablet 50 mg  50 mg Oral QHS Jomarie Longs, MD   50 mg at 04/11/16 2205    Lab  Results:  Results for orders placed or performed during the hospital encounter of 04/10/16 (from the past 48 hour(s))  TSH     Status: None   Collection Time: 04/12/16  6:06 AM  Result Value Ref Range   TSH 0.787 0.350 - 4.500 uIU/mL    Comment: Performed at [facility]  Lipid panel     Status: Abnormal   Collection Time: 04/12/16  6:06 AM  Result Value Ref Range   Cholesterol 196 0 - 200 mg/dL   Triglycerides 161 <096 mg/dL   HDL 40 (L) >04 mg/dL   Total CHOL/HDL Ratio 4.9 RATIO   VLDL 26 0 - 40 mg/dL   LDL Cholesterol 540 (H) 0 - 99 mg/dL    Comment:        Total Cholesterol/HDL:CHD Risk Coronary Heart Disease Risk Table                     Men   Women  1/2 Average Risk   3.4   3.3  Average Risk       5.0   4.4  2 X Average Risk   9.6   7.1  3 X Average Risk  23.4   11.0        Use the calculated Patient Ratio above and the CHD Risk Table to determine the patient's CHD Risk.        ATP III CLASSIFICATION (LDL):  <100     mg/dL   Optimal  981-191  mg/dL   Near or Above                    Optimal  130-159  mg/dL   Borderline  478-295  mg/dL   High  >621     mg/dL   Very High Performed at [facility]     Blood Alcohol level:  Lab Results  Component Value Date   [facility] <5 04/10/2016    Physical Findings: AIMS: Facial and Oral Movements Muscles of Facial Expression: None, normal Lips and Perioral Area: None, normal Jaw: None, normal Tongue: None, normal,Extremity Movements Upper (arms, wrists, hands, fingers): None, normal Lower (legs, knees, ankles, toes): None, normal, Trunk Movements Neck, shoulders, hips: None, normal, Overall Severity Severity of abnormal movements (highest score from questions above): None, normal Incapacitation due to abnormal movements: None, normal Patient's awareness of abnormal movements (rate only patient's report): No Awareness, Dental Status Current problems with teeth and/or dentures?: No Does patient  usually wear dentures?: No  CIWA:  CIWA-Ar  Total: 0 COWS:     Musculoskeletal: Strength & Muscle Tone: within normal limits Gait & Station: normal Patient leans: N/A  Psychiatric Specialty Exam: Review of Systems  Psychiatric/Behavioral: Positive for depression, hallucinations and substance abuse. The patient is nervous/anxious.   All other systems reviewed and are negative.   Blood pressure 135/94, pulse 73, temperature 98.6 F (37 C), temperature source Oral, resp. rate 18, height 5\' 9"  (1.753 m), weight 129.729 kg (286 lb), SpO2 99 %.Body mass index is 42.22 kg/(m^2).  General Appearance: Casual  Eye Contact::  Fair  Speech:  Clear and Coherent  Volume:  Normal  Mood:  Anxious and Irritable  Affect:  Congruent  Thought Process:  Linear  Orientation:  Full (Time, Place, and Person)  Thought Content:  Hallucinations: Auditory Command:  KILL SELF  Suicidal Thoughts:  No  Homicidal Thoughts:  No  Memory:  Immediate;   Fair Recent;   Fair Remote;   Fair  Judgement:  Impaired  Insight:  Shallow  Psychomotor Activity:  Restlessness  Concentration:  Fair  Recall:  [employer]  Fund of Knowledge:Fair  Language: Fair  Akathisia:  No  Handed:  Right  AIMS (if indicated):     Assets:  Desire for Improvement  ADL's:  Intact  Cognition: WNL  Sleep:  Number of Hours: 6.75   Treatment Plan Summary:Robert Kelly is a 35 y.o. 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   Daily contact with patient to assess and evaluate symptoms and progress in treatment and Medication management  Will continue Haldol 5 mg po bid for psychosis/mood sx. Will continue Cogentin 0.5 mg po bid for EPS. Will continue Celexa 10 mg po daily for PTSD sx. Will continue Depakote ER 750 mg po qhs for mood lability.  Depakote level - 04/15/16. Will make available PRN medications as per agitation protocol. Will continue to monitor vitals ,medication compliance and treatment side effects while patient is here.  Will monitor for medical issues as well as call consult as needed.  Reviewed labs CBC, CMP - WNL , UDS - POS for THC,lipid panel- wnl ,pending  hba1c, pending PL , ekg for qtc - wnl , tsh- wnl  CSW will continue working on disposition.  Patient to participate in therapeutic milieu .       Saurav Crumble, MD 04/12/2016, 10:10 AM

## 2016-04-12 NOTE — [facility] Group Notes (Signed)
[facility] LCSW Group Therapy  04/12/2016  1:05 PM  Type of Therapy:  Group therapy  Participation Level:  Active  Participation Quality:  Attentive  Affect:  Flat  Cognitive:  Oriented  Insight:  Limited  Engagement in Therapy:  Limited  Modes of Intervention:  Discussion, Socialization  Summary of Progress/Problems:  Chaplain was here to lead a group on themes of hope and courage.  "Hope is a seed.  It can easily sprout when it is cultivated.  We need others sometimes to help feed and water our hope." Went on to talk about his experience as a father and how his children give him hope, as well as the support he gets from his wife. Daryel Geraldorth, Frona Yost B 04/12/2016 1:19 PM

## 2016-04-13 LAB — HEMOGLOBIN A1C
HEMOGLOBIN A1C: 5.9 % — AB (ref 4.8–5.6)
MEAN PLASMA GLUCOSE: 123 mg/dL

## 2016-04-13 LAB — PROLACTIN: Prolactin: 15.9 ng/mL — ABNORMAL HIGH (ref 4.0–15.2)

## 2016-04-13 NOTE — Progress Notes (Signed)
[facility]  04/13/2016 10:09 AM Robert Kelly  MRN:  [id number] Subjective: Pt states " I am fabulous.'  Objective: Robert Kelly is a 35 y.o. 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  Patient seen by this NP today, case discussed with social worker and nursing. As per nurse no acute problem, tolerating medications without any side effect. No somatic complaints.  Patient evaluated and case reviewed 04/13/2016. Pt is alert/oriented x4, calm and cooperative during the evaluation. During evaluation patient reported having a good day yesterday adjusting to the unit and, tolerating dose of medication well last night.He denies suicidal/homicidal ideation, however continues to endorse auditory hallucination, anxiety, and depression/feeling sad. He denies AH at this time, good response to medications, denies ADRs.  Denies any side effects from the medications at this time. He is able to tolerate breakfast and no GI symptoms. He endorses better night's sleep last night, good appetite, no acute pain.Reports he continues to attend and participate in group mileu reporting his goal for today is to, " call my mother and be honest with her. Communication,trust and honesty" .  Per staff: Patient presents with anxious affect and depressed mood. Denies pain, auditory and visual hallucinations. Rates depression at 5, hopelessness at 4, and anxiety at 0. Maintained on routine safety checks. Medications given as prescribed. Support and encouragement offered as needed. Attended group and participated. States goal for today is "my faith." Patient observed socializing with peers in the dayroom. Offered   Per therapist:Pt rated his day a 8 because "I got good rest last night". He had no goal or discharge  plans.  Principal Problem: Schizoaffective disorder, bipolar type (HCC) Diagnosis:   Patient Active Problem List   Diagnosis Date Noted  . Schizoaffective disorder, bipolar type (HCC) [F25.0] 04/11/2016  . Cannabis use disorder, severe, dependence (HCC) [F12.20] 04/11/2016  . Obstructive sleep apnea [G47.33] 04/11/2016  . Chronic post-traumatic stress disorder (PTSD) [F43.12] 04/10/2016   Total Time spent with patient: 30 minutes  Past Psychiatric History: as per H&P.  Past Medical History:  Past Medical History  Diagnosis Date  . PTSD (post-traumatic stress disorder)    History reviewed. No pertinent past surgical history. Family History:  Family History  Problem Relation Age of Onset  . Diabetes Mother   . Alcohol abuse Father   . Alcohol abuse Paternal Grandfather    Family Psychiatric  History: As per H&P. Social History:  History  Alcohol Use  . Yes     History  Drug Use  . Yes  . Special: Marijuana    Social History   Social History  . Marital Status: Divorced    Spouse Name: N/A  . Number of Children: N/A  . Years of Education: N/A   Social History Main Topics  . Smoking status: Current Every Day Smoker  . Smokeless tobacco: None  . Alcohol Use: Yes  . Drug Use: Yes    Special: Marijuana  . Sexual Activity: Not Asked   Other Topics Concern  . None   Social History Narrative   Additional Social History:    Pain Medications: SEE MAR Prescriptions: SEE MAR Over the Counter: SEE MAR History of alcohol / drug use?: Yes Longest period of sobriety (when/how long): Unknown  Negative Consequences of Use: Legal Withdrawal Symptoms: Other (Comment) (Done) Name of Substance 1: Patient denied alcohol an drug use; UDS positive for THC 1 - Age of First Use: n/a 1 - Amount (size/oz): n/a 1 - Frequency: n/a 1 - Duration: n/a 1 - Last Use / Amount: n/a      Sleep: Fair  Appetite:  Fair  Current Medications: Current Facility-Administered  Medications  Medication Dose Route Frequency Provider Last Rate Last Dose  . acetaminophen (TYLENOL) tablet 650 mg  650 mg Oral Q4H PRN Earney Navy, NP      . alum & mag hydroxide-simeth (MAALOX/MYLANTA) 200-200-20 MG/5ML suspension 30 mL  30 mL Oral PRN Earney Navy, NP      . benztropine (COGENTIN) tablet 0.5 mg  0.5 mg Oral BID Robert Longs, MD   0.5 mg at 04/13/16 0912  . citalopram (CELEXA) tablet 10 mg  10 mg Oral Daily Robert Longs, MD   10 mg at 04/13/16 0912  . cloNIDine (CATAPRES) tablet 0.1 mg  0.1 mg Oral Q8H PRN Beau Fanny, FNP   0.1 mg at 04/13/16 0654  . divalproex (DEPAKOTE ER) 24 hr tablet 750 mg  750 mg Oral QHS Saramma Eappen, MD   750 mg at 04/12/16 2124  . haloperidol (HALDOL) tablet 5 mg  5 mg Oral BID Robert Longs, MD   5 mg at 04/13/16 0912  . magnesium hydroxide (MILK OF MAGNESIA) suspension 30 mL  30 mL Oral Daily PRN Earney Navy, NP      . nicotine (NICODERM CQ - dosed in mg/24 hours) patch 21 mg  21 mg Transdermal Daily Earney Navy, NP   21 mg at 04/13/16 0912  . ondansetron (ZOFRAN) tablet 4 mg  4 mg Oral Q8H PRN Earney Navy, NP      . traZODone (DESYREL) tablet 50 mg  50 mg Oral QHS Robert Longs, MD   50 mg at 04/12/16 2124    Lab Results:  Results for orders placed or performed during the hospital encounter of 04/10/16 (from the past 48 hour(s))  TSH     Status: None   Collection Time: 04/12/16  6:06 AM  Result Value Ref Range   TSH 0.787 0.350 - 4.500 uIU/mL    Comment: Performed at [facility]  Lipid panel     Status: Abnormal   Collection Time: 04/12/16  6:06 AM  Result Value Ref Range   Cholesterol 196 0 - 200 mg/dL   Triglycerides 161 <096 mg/dL   HDL 40 (L) >04 mg/dL   Total CHOL/HDL Ratio 4.9 RATIO   VLDL 26 0 - 40 mg/dL   LDL Cholesterol 540 (H) 0 - 99 mg/dL    Comment:        Total Cholesterol/HDL:CHD Risk Coronary Heart Disease Risk Table                     Men   Women  1/2  Average Risk   3.4   3.3  Average Risk       5.0   4.4  2 X Average Risk   9.6   7.1  3 X Average Risk  23.4   11.0        Use the calculated Patient Ratio above and the CHD Risk Table to determine the patient's CHD Risk.        ATP III CLASSIFICATION (LDL):  <100     mg/dL   Optimal  100-129  mg/dL   Near or Above                    Optimal  130-159  mg/dL   Borderline  161-096  mg/dL   High  >045     mg/dL   Very High Performed at [facility]   Hemoglobin A1c     Status: Abnormal   Collection Time: 04/12/16  6:06 AM  Result Value Ref Range   Hgb A1c MFr Bld 5.9 (H) 4.8 - 5.6 %    Comment: (NOTE)         Pre-diabetes: 5.7 - 6.4         Diabetes: >6.4         Glycemic control for adults with diabetes: <7.0    Mean Plasma Glucose 123 mg/dL    Comment: (NOTE) Performed At: [facility] [address] [place], [place] [id number] Mila Homer MD NW:[id number] Performed at [facility]   Prolactin     Status: Abnormal   Collection Time: 04/12/16  6:06 AM  Result Value Ref Range   Prolactin 15.9 (H) 4.0 - 15.2 ng/mL    Comment: (NOTE) Performed At: [facility] [address] [place], [place] [id number] Mila Homer MD BM:[id number] Performed at [facility]     Blood Alcohol level:  Lab Results  Component Value Date   [facility] <5 04/10/2016    Physical Findings: AIMS: Facial and Oral Movements Muscles of Facial Expression: None, normal Lips and Perioral Area: None, normal Jaw: None, normal Tongue: None, normal,Extremity Movements Upper (arms, wrists, hands, fingers): None, normal Lower (legs, knees, ankles, toes): None, normal, Trunk Movements Neck, shoulders, hips: None, normal, Overall Severity Severity of abnormal movements (highest score from questions above): None, normal Incapacitation due to abnormal movements: None, normal Patient's awareness of abnormal movements (rate only patient's  report): No Awareness, Dental Status Current problems with teeth and/or dentures?: No Does patient usually wear dentures?: No  CIWA:  CIWA-Ar Total: 0 COWS:     Musculoskeletal: Strength & Muscle Tone: within normal limits Gait & Station: normal Patient leans: N/A  Psychiatric Specialty Exam: Review of Systems  Psychiatric/Behavioral: Positive for depression, hallucinations and substance abuse. The patient is nervous/anxious.   All other systems reviewed and are negative.   Blood pressure 147/92, pulse 81, temperature 98.4 F (36.9 C), temperature source Oral, resp. rate 16, height 5\' 9"  (1.753 m), weight 129.729 kg (286 lb), SpO2 99 %.Body mass index is 42.22 kg/(m^2).  General Appearance: Casual  Eye Contact::  Fair  Speech:  Clear and Coherent  Volume:  Normal  Mood:  Anxious and Irritable  Affect:  Congruent  Thought Process:  Linear  Orientation:  Full (Time, Place, and Person)  Thought Content:  Hallucinations: Auditory Command:  KILL SELF  Suicidal Thoughts:  No  Homicidal Thoughts:  No  Memory:  Immediate;   Fair Recent;   Fair Remote;   Fair  Judgement:  Impaired  Insight:  Shallow  Psychomotor Activity:  Restlessness  Concentration:  Fair  Recall:  [employer] of Knowledge:Fair  Language: Fair  Akathisia:  No  Handed:  Right  AIMS (if indicated):     Assets:  Desire for Improvement  ADL's:  Intact  Cognition: WNL  Sleep:  Number of Hours: 6.5   Treatment Plan Summary:Trevon Judie Petit Hoglund is a 35 y.o. AA male, who is an ex marine, currently on SSD, lives with his wife  and 6 children in [place], has a history of PTSD and bipolar disorder, who presented to [facility] with GPD due to complaints of suicidal ideations increasing over the past month.   Daily contact with patient to assess and evaluate symptoms and progress in treatment and Medication management  Will continue Haldol 5 mg po bid for psychosis/mood sx. Will continue Cogentin 0.5 mg po bid for EPS. Will continue  Celexa 10 mg po daily for PTSD sx. Will continue Depakote ER 750 mg po qhs for mood lability. Depakote level - 04/15/16. Will make available PRN medications as per agitation protocol. Will continue to monitor vitals ,medication compliance and treatment side effects while patient is here.  Will monitor for medical issues as well as call consult as needed.  Reviewed labs CBC, CMP - WNL , UDS - POS for THC,lipid panel- wnl ,hba1c 5.9, Prolactin 15.9 , ekg for qtc - wnl , tsh- wnl  CSW will continue working on disposition.  Patient to participate in therapeutic milieu .   Truman Hayward, FNP 04/13/2016, 10:09 AM I agreed with findings and treatment plan of this patient

## 2016-04-13 NOTE — Progress Notes (Signed)
D). Patient slept late this morning after breakfast. His BP was down to 132/78 at 12:00pm. His temp was 99.1. Patient denies SI/HI/AVH and pain. On his self inventory he reports 0/10 for depression, anxiety and hopelessness. He attended group and has been cooperative, calm and appropriate on the unit all day. A) Safety maintained through out shift. R) Continue to maintain safety on the unit.

## 2016-04-13 NOTE — [facility] Group Notes (Signed)
[facility] Group Notes:  (Clinical Social Work)  04/13/2016  11:15-12:00PM  Summary of Progress/Problems:   Today's process group involved patients discussing their feelings related to being hospitalized, as well as how they can create a plan for how to stay out of the hospital in the future.  Much of that discussion involved staying on medications and keeping doctor appointments, and what barriers need to be overcome in order to accomplish those things.  The patient expressed his primary feeling about being hospitalized is okay, because he recognizes he needed help.  He said he needs to stay on his medicine and sometimes does not like taking it daily, says "I'm not going to take it today."  He said his wife will make sure he takes it, and she has gone so far as to hide it in his food previously.  We talked about him asking for a copy of his intake picture to use as a reminder of how he does not want to feel that bad again.  He liked the idea of putting it on his bathroom mirror.  Type of Therapy:  Group Therapy - Process  Participation Level:  Active  Participation Quality:  Attentive and Sharing  Affect:  Blunted  Cognitive:  Appropriate and Oriented  Insight:  Developing/Improving  Engagement in Therapy:  Engaged  Modes of Intervention:  Exploration, Discussion  Ambrose MantleMareida Grossman-Orr, LCSW 04/13/2016, 12:31 PM

## 2016-04-13 NOTE — Progress Notes (Signed)
D: Pt is flat, isolative and withdrawn to self even when in the dayroom. Pt denied depression, anxiety, pain, SI, HI or AVH; states, "I think the medications are working; I am feel real good."  Pt remained respectful, calm and cooperative. A: Medications offered as prescribed.  Support, encouragement, and safe environment provided.  15-minute safety checks continue. R: Pt was med compliant.  Pt attended wrap-up group. Safety checks continue.

## 2016-04-13 NOTE — [facility] Group Notes (Signed)
[facility] Group Notes:  (Nursing/MHT/Case Management/Adjunct)  Date:  04/13/2016  Time:  3:28 PM  Type of Therapy:  Nurse Education  Participation Level:  Active  Participation Quality:  Appropriate and Attentive  Affect:  Appropriate  Cognitive:  Alert and Appropriate  Insight:  Improving  Engagement in Group:  Engaged  Modes of Intervention:  Problem-solving  Summary of Progress/Problems: Patient attended the self inventory group. He was able to rate his depression at 0/10 , anxiety at 0/10 and hopelessness at 0/10. He she denies SI/HI/AVH. He reports no concerns at this time. He was able to state his coping skills for depression as, "Talking to his kids, dog and wife and playing video games".  Almira Barenny G Vinh Sachs 04/13/2016, 3:28 PM

## 2016-04-13 NOTE — Plan of Care (Signed)
Problem: Alteration in mood Goal: LTG-Patient reports reduction in suicidal thoughts (Patient reports reduction in suicidal thoughts and is able to verbalize a safety plan for whenever patient is feeling suicidal)  Outcome: Progressing Patient was able to verbally deny SI and contract for safety while on the unit.

## 2016-04-14 MED ORDER — LOSARTAN POTASSIUM 50 MG PO TABS
50.0000 mg | ORAL_TABLET | Freq: Every day | ORAL | Status: DC
  Administered 2016-04-14 – 2016-04-15 (×2): 50 mg via ORAL
  Filled 2016-04-14 (×4): qty 1
  Filled 2016-04-14: qty 10

## 2016-04-14 NOTE — Progress Notes (Signed)
[facility] MD Progress Note  04/14/2016 11:06 AM Robert Kelly  MRN:  [id number] Subjective: Pt states " I am better. Mom and my brother came yesterday and we talked about faith.'  Objective: Robert Kelly is a 35 y.o. AA male , who is an ex marine , currently on SSD , lives with his wife and 6 children in [place] , has a history of PTSD and bipolar disorder, who presented to [facility] with GPD due to complaints of suicidal ideations increasing over the past month.  Patient seen by this NP today, case discussed with social worker and nursing. As per nurse no acute problem, tolerating medications without any side effect. No somatic complaints.  Patient evaluated and case reviewed 04/13/2016. Pt is alert/oriented x4, calm and cooperative during the evaluation. During evaluation patient is observed up and ambulating in the dayroom, interacting well with his peers. He denies suicidal/homicidal ideation, however continues to endorse auditory hallucination, anxiety, and depression/feeling sad. He denies AH at this time, good response to medications, denies ADRs.  Denies any side effects from the medications at this time. He is able to tolerate breakfast and no GI symptoms. He endorses better night's sleep last night, good appetite. Reports he continues to attend and participate in group mileu reporting his goal for today is to, " ways to remain compliant with medication upon discharge."  Per staff: Patient slept late this morning after breakfast. His BP was down to 132/78 at 12:00pm. His temp was 99.1. Patient denies SI/HI/AVH and pain. On his self inventory he reports 0/10 for depression, anxiety and hopelessness. He attended group and has been cooperative, calm and appropriate on the unit all day.   Principal Problem: Schizoaffective disorder, bipolar type (HCC) Diagnosis:   Patient Active Problem List   Diagnosis Date Noted  . Schizoaffective disorder, bipolar type (HCC) [F25.0] 04/11/2016  . Cannabis use  disorder, severe, dependence (HCC) [F12.20] 04/11/2016  . Obstructive sleep apnea [G47.33] 04/11/2016  . Chronic post-traumatic stress disorder (PTSD) [F43.12] 04/10/2016   Total Time spent with patient: 30 minutes  Past Psychiatric History: as per H&P.  Past Medical History:  Past Medical History  Diagnosis Date  . PTSD (post-traumatic stress disorder)    History reviewed. No pertinent past surgical history. Family History:  Family History  Problem Relation Age of Onset  . Diabetes Mother   . Alcohol abuse Father   . Alcohol abuse Paternal Grandfather    Family Psychiatric  History: As per H&P. Social History:  History  Alcohol Use  . Yes     History  Drug Use  . Yes  . Special: Marijuana    Social History   Social History  . Marital Status: Divorced    Spouse Name: N/A  . Number of Children: N/A  . Years of Education: N/A   Social History Main Topics  . Smoking status: Current Every Day Smoker  . Smokeless tobacco: None  . Alcohol Use: Yes  . Drug Use: Yes    Special: Marijuana  . Sexual Activity: Not Asked   Other Topics Concern  . None   Social History Narrative   Additional Social History:    Pain Medications: SEE MAR Prescriptions: SEE MAR Over the Counter: SEE MAR History of alcohol / drug use?: Yes Longest period of sobriety (when/how long): Unknown Negative Consequences of Use: Legal Withdrawal Symptoms: Other (Comment) (Done) Name of Substance 1: Patient denied alcohol an drug use; UDS positive for THC 1 - Age of First Use:  n/a 1 - Amount (size/oz): n/a 1 - Frequency: n/a 1 - Duration: n/a 1 - Last Use / Amount: n/a      Sleep: Fair  Appetite:  Fair  Current Medications: Current Facility-Administered Medications  Medication Dose Route Frequency Provider Last Rate Last Dose  . acetaminophen (TYLENOL) tablet 650 mg  650 mg Oral Q4H PRN Earney NavyJosephine C Onuoha, NP      . alum & mag hydroxide-simeth (MAALOX/MYLANTA) 200-200-20 MG/5ML  suspension 30 mL  30 mL Oral PRN Earney NavyJosephine C Onuoha, NP      . benztropine (COGENTIN) tablet 0.5 mg  0.5 mg Oral BID Jomarie LongsSaramma Eappen, MD   0.5 mg at 04/14/16 0916  . citalopram (CELEXA) tablet 10 mg  10 mg Oral Daily Jomarie LongsSaramma Eappen, MD   10 mg at 04/14/16 0916  . cloNIDine (CATAPRES) tablet 0.1 mg  0.1 mg Oral Q8H PRN Beau FannyJohn C Withrow, FNP   0.1 mg at 04/14/16 16100626  . divalproex (DEPAKOTE ER) 24 hr tablet 750 mg  750 mg Oral QHS Saramma Eappen, MD   750 mg at 04/13/16 2114  . haloperidol (HALDOL) tablet 5 mg  5 mg Oral BID Jomarie LongsSaramma Eappen, MD   5 mg at 04/14/16 0916  . magnesium hydroxide (MILK OF MAGNESIA) suspension 30 mL  30 mL Oral Daily PRN Earney NavyJosephine C Onuoha, NP      . nicotine (NICODERM CQ - dosed in mg/24 hours) patch 21 mg  21 mg Transdermal Daily Earney NavyJosephine C Onuoha, NP   21 mg at 04/13/16 0912  . ondansetron (ZOFRAN) tablet 4 mg  4 mg Oral Q8H PRN Earney NavyJosephine C Onuoha, NP      . traZODone (DESYREL) tablet 50 mg  50 mg Oral QHS Jomarie LongsSaramma Eappen, MD   50 mg at 04/13/16 2114    Lab Results:  No results found for this or any previous visit (from the past 48 hour(s)).  Blood Alcohol level:  Lab Results  Component Value Date   ETH <5 04/10/2016    Physical Findings: AIMS: Facial and Oral Movements Muscles of Facial Expression: None, normal Lips and Perioral Area: None, normal Jaw: None, normal Tongue: None, normal,Extremity Movements Upper (arms, wrists, hands, fingers): None, normal Lower (legs, knees, ankles, toes): None, normal, Trunk Movements Neck, shoulders, hips: None, normal, Overall Severity Severity of abnormal movements (highest score from questions above): None, normal Incapacitation due to abnormal movements: None, normal Patient's awareness of abnormal movements (rate only patient's report): No Awareness, Dental Status Current problems with teeth and/or dentures?: No Does patient usually wear dentures?: No  CIWA:  CIWA-Ar Total: 0 COWS:     Musculoskeletal: Strength &  Muscle Tone: within normal limits Gait & Station: normal Patient leans: N/A  Psychiatric Specialty Exam: Review of Systems  Psychiatric/Behavioral: Positive for depression, hallucinations and substance abuse. The patient is nervous/anxious.   All other systems reviewed and are negative.   Blood pressure 142/96, pulse 80, temperature 98.2 F (36.8 C), temperature source Oral, resp. rate 20, height 5\' 9"  (1.753 m), weight 129.729 kg (286 lb), SpO2 99 %.Body mass index is 42.22 kg/(m^2).  General Appearance: Casual  Eye Contact::  Fair  Speech:  Clear and Coherent  Volume:  Normal  Mood:  Euthymic  Affect:  Appropriate and Congruent  Thought Process:  Linear  Orientation:  Full (Time, Place, and Person)  Thought Content:  WDL  Suicidal Thoughts:  No  Homicidal Thoughts:  No  Memory:  Immediate;   Fair Recent;   Fair Remote;  Fair  Judgement:  Impaired  Insight:  Shallow  Psychomotor Activity:  Restlessness  Concentration:  Fair  Recall:  [employer] of Knowledge:Fair  Language: Fair  Akathisia:  No  Handed:  Right  AIMS (if indicated):     Assets:  Desire for Improvement  ADL's:  Intact  Cognition: WNL  Sleep:  Number of Hours: 5.5   Treatment Plan Summary:Marque Judie Petit Javid is a 36 y.o. AA male, who is an ex marine, currently on SSD, lives with his wife and 6 children in [place], has a history of PTSD and bipolar disorder, who presented to [facility] with GPD due to complaints of suicidal ideations increasing over the past month.   Daily contact with patient to assess and evaluate symptoms and progress in treatment and Medication management Will start Losartan  po daily for hypertension.  Will continue Haldol 5 mg po bid for psychosis/mood sx. Will continue Cogentin 0.5 mg po bid for EPS. Will continue Celexa 10 mg po daily for PTSD sx. Will continue Depakote ER 750 mg po qhs for mood lability. Depakote level - 04/15/16. Will make available PRN medications as per agitation  protocol. Will continue to monitor vitals ,medication compliance and treatment side effects while patient is here.  Will monitor for medical issues as well as call consult as needed.  Reviewed labs CBC, CMP - WNL , UDS - POS for THC,lipid panel- wnl ,hba1c 5.9, Prolactin 15.9 , ekg for qtc - wnl , tsh- wnl  CSW will continue working on disposition.  Patient to participate in therapeutic milieu .   Truman Hayward, FNP 04/14/2016, 11:06 AM i agree with the notes and plan.    Thresa Ross, MD

## 2016-04-14 NOTE — Progress Notes (Signed)
Patient ID: Robert Kelly, male   DOB: [birth year], 35 y.o.   MRN: [id number]   D: Pt has been appropriate on the unit today, he was requesting to be discharged. Pt reported that he felt he was ready to go home. Pt attended all groups and engaged in treatment. Pt took all medications as prescribed by the doctor. Pt reported that his depression was a 0, his hopelessness was a 0, and his anxiety was a 0. Pt reported that his goal for today was to work on his faith. Pt was started on medication for his blood pressure, pt was made aware of medication prior to the administration. No issues or concerns noted. Pt reported being negative SI/HI, no AH/VH noted. A: 15 min checks continued for patient safety. R: Pt safety maintained.

## 2016-04-14 NOTE — [facility] Group Notes (Signed)
[facility] Group Notes:  (Clinical Social Work)  04/14/2016  11:00AM-12:00PM  Summary of Progress/Problems:  The main focus of today's process group was to listen to a variety of genres of music and to identify that different types of music provoke different responses.  The patient expressed at the beginning of group that 70s soul music is very enjoyable for him, and when some was played, he stated it made him feel like cooking, because that is what he listens to while cooking.  He did not really share anything about a mother in his life when we discussed this since it is Mother's Day.  Type of Therapy:  Music Therapy   Participation Level:  Active  Participation Quality:  Attentive and Sharing  Affect:  Appropriate  Cognitive:  Oriented  Insight:  Engaged  Engagement in Therapy:  Engaged  Modes of Intervention:   Activity, Exploration  Ambrose MantleMareida Grossman-Orr, LCSW 04/14/2016

## 2016-04-14 NOTE — Progress Notes (Signed)
D: Pt is flat, isolative and withdrawn to self even when in the dayroom. Pt denied depression, anxiety, pain, SI, HI or AVH; states, "I feel good about myself."  Pt remained respectful, calm and cooperative. A: Medications offered as prescribed.  Support, encouragement, and safe environment provided.  15-minute safety checks continue. R: Pt was med compliant.  Pt attended wrap-up group. Safety checks continue.

## 2016-04-15 LAB — VALPROIC ACID LEVEL: Valproic Acid Lvl: 39 ug/mL — ABNORMAL LOW (ref 50.0–100.0)

## 2016-04-15 MED ORDER — BENZTROPINE MESYLATE 0.5 MG PO TABS
0.5000 mg | ORAL_TABLET | Freq: Two times a day (BID) | ORAL | Status: DC
Start: 2016-04-15 — End: 2018-09-15

## 2016-04-15 MED ORDER — DIVALPROEX SODIUM ER 250 MG PO TB24: 750.0000 mg | ORAL_TABLET | Freq: Every day | ORAL | Status: DC

## 2016-04-15 MED ORDER — TRAZODONE HCL 50 MG PO TABS: 50.0000 mg | ORAL_TABLET | Freq: Every day | ORAL | Status: DC

## 2016-04-15 MED ORDER — NICOTINE 21 MG/24HR TD PT24: 21.0000 mg | MEDICATED_PATCH | Freq: Every day | TRANSDERMAL | Status: DC

## 2016-04-15 MED ORDER — LOSARTAN POTASSIUM 50 MG PO TABS: 50.0000 mg | ORAL_TABLET | Freq: Every day | ORAL | Status: DC

## 2016-04-15 MED ORDER — CITALOPRAM HYDROBROMIDE 10 MG PO TABS: 10.0000 mg | ORAL_TABLET | Freq: Every day | ORAL | Status: DC

## 2016-04-15 MED ORDER — HALOPERIDOL 5 MG PO TABS: 5.0000 mg | ORAL_TABLET | Freq: Two times a day (BID) | ORAL | Status: DC

## 2016-04-15 NOTE — Tx Team (Signed)
Interdisciplinary Treatment Plan Update (Adult)  Date:  04/15/2016   Time Reviewed:  10:22 AM   Progress in Treatment: Attending groups: Yes. Participating in groups:  Yes. Taking medication as prescribed:  Yes. Tolerating medication:  Yes. Family/Significant other contact made:  Yes Patient understands diagnosis:  Yes  As evidenced by seeking help with voices and depression Discussing patient identified problems/goals with staff:  Yes, see initial care plan. Medical problems stabilized or resolved:  Yes. Denies suicidal/homicidal ideation: Yes. Issues/concerns per patient self-inventory:  No. Other:  New problem(s) identified:  Discharge Plan or Barriers: see below  Reason for Continuation of Hospitalization:   Comments: Pt today reports mood lability, sleep issues , SI and psychosis. Patient will benefit from inpatient treatment and stabilization.  Estimated length of stay is 5-7 days.  Reviewed past medical records,treatment plan.  Will start a trial of Haldol 5 mg po bid for psychosis/mood sx. Will add Cogentin 0.5 mg po bid for EPS. Will start Celexa 10 mg po daily for PTSD sx. Will add Depakote ER 750 mg po qhs for mood lability. Depakote level in 5 days. Will make available PRN medications as per agitation protocol.  Estimated length of stay: D/C today  New goal(s):  Review of initial/current patient goals per problem list:   Review of initial/current patient goals per problem list:  1. Goal(s): Patient will participate in aftercare plan   Met: Yes   Target date: 3-5 days post admission date   As evidenced by: Patient will participate within aftercare plan AEB aftercare provider and housing plan at discharge being identified. 04/11/16:  Return home, follow up outpt   2. Goal (s): Patient will exhibit decreased depressive symptoms and suicidal ideations.   Met: Yes   Target date: 3-5 days post admission date   As evidenced by: Patient will  utilize self rating of depression at 3 or below and demonstrate decreased signs of depression or be deemed stable for discharge by MD. 04/11/16:  Rates his depression a 7 today 04/15/16:  Denies depression today      5. Goal(s): Patient will demonstrate decreased signs of psychosis  * Met: Yes  * Target date: 3-5 days post admission date  * As evidenced by: Patient will demonstrate decreased frequency of AVH or return to baseline function 04/11/16:  States he has been hearing voices telling him to hurt others 04/15/16:  No signs nor symptoms of psychosis today   6. Goal (s): Patient will demonstrate decreased signs of mania  * Met: Yes  * Target date: 3-5 days post admission date  * As evidenced by: Patient demonstrate decreased signs of mania AEB decreased mood instability and return to baseline functioning 04/11/16:  Started on depakote today to address symptoms of irritability, anger, poor sleep, impulsivity. 04/15/16:  No signs nor symptoms of mood instability today       Attendees: Patient:  04/15/2016 10:22 AM   Family:   04/15/2016 10:22 AM   Physician:  Ursula Alert, MD 04/15/2016 10:22 AM   Nursing:   Gaylan Gerold, RN 04/15/2016 10:22 AM   CSW:    Roque Lias, LCSW   04/15/2016 10:22 AM   Other:  04/15/2016 10:22 AM   Other:   04/15/2016 10:22 AM   Other:  Lars Pinks, Nurse CM 04/15/2016 10:22 AM   Other:   04/15/2016 10:22 AM   Other:  Norberto Sorenson, [place]  04/15/2016 10:22 AM   Other:  04/15/2016 10:22 AM   Other:  04/15/2016 10:22  AM   Other:  04/15/2016 10:22 AM   Other:  04/15/2016 10:22 AM   Other:  04/15/2016 10:22 AM   Other:   04/15/2016 10:22 AM    Scribe for Treatment Team:   Trish Mage, 04/15/2016 10:22 AM

## 2016-04-15 NOTE — [facility] Suicide Risk Assessment (Addendum)
[facility]   Principal Problem: Schizoaffective disorder, bipolar type [facility](HCC) Discharge Diagnoses:  Patient Active Problem List   Diagnosis Date Noted  . Schizoaffective disorder, bipolar type (HCC) [F25.0] 04/11/2016  . Cannabis use disorder, severe, dependence (HCC) [F12.20] 04/11/2016  . Obstructive sleep apnea [G47.33] 04/11/2016  . Chronic post-traumatic stress disorder (PTSD) [F43.12] 04/10/2016    Total Time spent with patient: 30 minutes  Musculoskeletal: Strength & Muscle Tone: within normal limits Gait & Station: normal Patient leans: N/A  Psychiatric Specialty Exam: ROS  Blood pressure 160/89, pulse 71, temperature 98.4 F (36.9 C), temperature source Oral, resp. rate 16, height 5\' 9"  (1.753 m), weight 286 lb (129.729 kg), SpO2 99 %.Body mass index is 42.22 kg/(m^2).  General Appearance: Well Groomed  Eye Contact::  Good  Speech:  Normal Rate409  Volume:  Normal  Mood:  improved  Affect:  Appropriate  Thought Process:  Linear  Orientation:  Full (Time, Place, and Person)  Thought Content:  denies hallucinations , not internally preoccupied at this time, no delusions expressed   Suicidal Thoughts:  No- denies any current suicidal or self injurious ideations   Homicidal Thoughts:  No denies any homicidal or violent ideations  Memory:  recent and remote grossly intact   Judgement:  Other:  improved  Insight:  improved   Psychomotor Activity:  Normal  Concentration:  Good  Recall:  Good  Fund of Knowledge:Good  Language: Good  Akathisia:  Negative  Handed:  Right  AIMS (if indicated):   no abnormal involuntary movements noted, no akathisia   Assets:  Desire for Improvement Physical Health  Sleep:  Number of Hours: 6.75  Cognition: WNL  ADL's:  Intact   Mental Status Per Nursing Assessment::   On Admission:  Suicidal ideation indicated by patient  Demographic Factors:  35 year old male, married , 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, Sales executiveMilitary Veteran     Loss Factors: Child support, legal issues   Historical Factors: History of mood disorder, has been diagnosed with Bipolar spectrum disorder, history of PTSD, history of cannabis abuse   Risk Reduction Factors:   Responsible for children under 34 years of age, Sense of responsibility to family, Living with another person, especially a relative and Positive social support  Continued Clinical Symptoms:  At this time patient is calm, pleasant, cooperative on approach, mood is " better" and affect is reactive, no thought disorder noted, no SI, no HI, no psychotic symptoms, future oriented.  Denies medication side effects at this time   Cognitive Features That Contribute To Risk:  No gross cognitive deficits noted upon discharge. Is alert , attentive, and oriented x 3     Suicide Risk:  Mild:  Suicidal ideation of limited frequency, intensity, duration, and specificity.  There are no identifiable plans, no associated intent, mild dysphoria and related symptoms, good self-control (both objective and subjective assessment), few other risk factors, and identifiable protective factors, including available and accessible social support.  Follow-up Information    [facility]24/2016.   Why:  Wednesday at 10:30 with Ms Allena Katzatel in Mental health Intake.  After you are opened for services, [facility] if you prefer.   Contact information:   [address]  [facility] [[id number]] [phone]        Plan Of Care/Follow-up recommendations:  Activity:  as tolerated  Diet:  heart healthy Tests:  NA Other:  see below  Patient has requested discharge and  there are no current grounds for involuntary commitment  He  is leaving unit in good spirits Plans to follow up as above . Encouraged to maintain sobriety from illicit drugs as part of treatment goals  Nehemiah Massed, MD 04/15/2016, 2:03 PM

## 2016-04-15 NOTE — Progress Notes (Signed)
Pt discharged home with wife. Pt was ambulatory, stable and appreciative at that time. All papers and prescriptions were given and valuables returned. Verbal understanding expressed. Denies SI/HI and A/VH. Pt given opportunity to express concerns and ask questions.  

## 2016-04-15 NOTE — Discharge Summary (Signed)
Physician Discharge Summary Note  Patient:  Robert Kelly is an 35 y.o., male MRN:  [id number] DOB:  1981/11/01 Patient phone:  [phone] (home)  Patient address:   [address] [place] [id number],  Total Time spent with patient: 30 minutes  Date of Admission:  04/10/2016 Date of Discharge: 04/15/2016  Reason for Admission:  psychosis  Principal Problem: Schizoaffective disorder, bipolar type [facility](HCC) Discharge Diagnoses: Patient Active Problem List   Diagnosis Date Noted  . Schizoaffective disorder, bipolar type (HCC) [F25.0] 04/11/2016  . Cannabis use disorder, severe, dependence (HCC) [F12.20] 04/11/2016  . Obstructive sleep apnea [G47.33] 04/11/2016  . Chronic post-traumatic stress disorder (PTSD) [F43.12] 04/10/2016    Past Psychiatric History: see above noted  Past Medical History:  Past Medical History  Diagnosis Date  . PTSD (post-traumatic stress disorder)    History reviewed. No pertinent past surgical history. Family History:  Family History  Problem Relation Age of Onset  . Diabetes Mother   . Alcohol abuse Father   . Alcohol abuse Paternal Grandfather    Family Psychiatric  History:  Denied Social History:  History  Alcohol Use  . Yes     History  Drug Use  . Yes  . Special: Marijuana    Social History   Social History  . Marital Status: Divorced    Spouse Name: N/A  . Number of Children: N/A  . Years of Education: N/A   Social History Main Topics  . Smoking status: Current Every Day Smoker  . Smokeless tobacco: None  . Alcohol Use: Yes  . Drug Use: Yes    Special: Marijuana  . Sexual Activity: Not Asked   Other Topics Concern  . None   Social History Narrative    Hospital Course:   Robert Kelly was admitted for Schizoaffective disorder, bipolar type [facility](HCC) and crisis management.  She was treated with medications listed below.  Medical problems were identified and treated as needed.  Home medications were restarted as  appropriate.  Improvement was monitored by observation and Robert Kelly daily report of symptom reduction.  Emotional and mental status was monitored by daily self inventory reports completed by Robert Kelly and clinical staff.  Patient reported continued improvement, denied any new concerns.  Patient had been compliant on medications and denied side effects.  Support and encouragement was provided.    Patient did well during inpatient stay.  At time of discharge, patient rated both depression and anxiety levels to be manageable and minimal.  Patient was able to identify the triggers of emotional crises and de-stabilizations.  Patient identified the positive things in life that would help in dealing with feelings of loss, depression and unhealthy or abusive tendencies.         Robert Kelly was evaluated by the treatment team for stability and plans for continued recovery upon discharge.  He was offered further treatment options upon discharge including Residential, Intensive Outpatient and Outpatient treatment.  She will follow up with agencies listed below for medication management and counseling.  Encouraged patient to maintain satisfactory support network and home environment.  Advised to adhere to medication compliance and outpatient treatment follow up.      Robert Kelly motivation was an integral factor for scheduling further treatment.  Employment, transportation, bed availability, health status, family support, and any pending legal issues were also considered during his hospital stay.  Upon completion of this admission the patient was both mentally and medically stable for discharge  denying suicidal/homicidal ideation, auditory/visual/tactile hallucinations, delusional thoughts and paranoia.       Physical Findings: AIMS: Facial and Oral Movements Muscles of Facial Expression: None, normal Lips and Perioral Area: None, normal Jaw: None, normal Tongue: None, normal,Extremity  Movements Upper (arms, wrists, hands, fingers): None, normal Lower (legs, knees, ankles, toes): None, normal, Trunk Movements Neck, shoulders, hips: None, normal, Overall Severity Severity of abnormal movements (highest score from questions above): None, normal Incapacitation due to abnormal movements: None, normal Patient's awareness of abnormal movements (rate only patient's report): No Awareness, Dental Status Current problems with teeth and/or dentures?: No Does patient usually wear dentures?: No  CIWA:  CIWA-Ar Total: 0 COWS:     Musculoskeletal: Strength & Muscle Tone: within normal limits Gait & Station: normal Patient leans: N/A  Psychiatric Specialty Exam:  See MD SRA Review of Systems  Psychiatric/Behavioral: Negative for suicidal ideas. The patient is not nervous/anxious.   All other systems reviewed and are negative.   Blood pressure 160/89, pulse 71, temperature 98.4 F (36.9 C), temperature source Oral, resp. rate 16, height  (1.753 m), weight 129.729 kg (286 lb), SpO2 99 %.Body mass index is 42.22 kg/(m^2).  Have you used any form of tobacco in the last 30 days? (Cigarettes, Smokeless Tobacco, Cigars, and/or Pipes): Yes  Has this patient used any form of tobacco in the last 30 days? (Cigarettes, Smokeless Tobacco, Cigars, and/or Pipes) Yes, N/A  Blood Alcohol level:  Lab Results  Component Value Date   ETH <5 04/10/2016    Metabolic Disorder Labs:  Lab Results  Component Value Date   HGBA1C 5.9* 04/12/2016   MPG 123 04/12/2016   Lab Results  Component Value Date   PROLACTIN 15.9* 04/12/2016   Lab Results  Component Value Date   CHOL 196 04/12/2016   TRIG 128 04/12/2016   HDL 40* 04/12/2016   CHOLHDL 4.9 04/12/2016   VLDL 26 04/12/2016   LDLCALC 130* 04/12/2016    See Psychiatric Specialty Exam and Suicide Risk Assessment completed by Attending Physician prior to discharge.  Discharge destination:  Home  Is patient on multiple antipsychotic  therapies at discharge:  No   Has Patient had three or more failed trials of antipsychotic monotherapy by history:  No  Recommended Plan for Multiple Antipsychotic Therapies: NA     Medication List    STOP taking these medications        meloxicam 15 MG tablet  Commonly known as:  MOBIC     sertraline 100 MG tablet  Commonly known as:  ZOLOFT      TAKE these medications      Indication   benztropine 0.5 MG tablet  Commonly known as:  COGENTIN  Take 1 tablet (0.5 mg total) by mouth 2 (two) times daily.   Indication:  Extrapyramidal Reaction caused by Medications     citalopram 10 MG tablet  Commonly known as:  CELEXA  Take 1 tablet (10 mg total) by mouth daily.   Indication:  Depression     divalproex 250 MG 24 hr tablet  Commonly known as:  DEPAKOTE ER  Take 3 tablets (750 mg total) by mouth at bedtime.   Indication:  mood stabilization     haloperidol 5 MG tablet  Commonly known as:  HALDOL  Take 1 tablet (5 mg total) by mouth 2 (two) times daily.   Indication:  mood stabilization     losartan 50 MG tablet  Commonly known as:  COZAAR  Take 1 tablet (  50 mg total) by mouth daily.   Indication:  High Blood Pressure     nicotine 21 mg/24hr patch  Commonly known as:  NICODERM CQ - dosed in mg/24 hours  Place 1 patch (21 mg total) onto the skin daily.   Indication:  Nicotine Addiction     traZODone 50 MG tablet  Commonly known as:  DESYREL  Take 1 tablet (50 mg total) by mouth at bedtime.   Indication:  Trouble Sleeping           Follow-up Information    Follow up with [facility] On 04/24/2016.   Why:  Wednesday at 10:30 with Ms Allena Katz in Mental health Intake.  After you are opened for services, you can be switched to [facility] if you prefer.   Contact information:   [address]  [facility] [phone]        Follow-up recommendations:  Activity:  as tol Diet:  as tol  Comments:  1.  Take all your medications as prescribed.   2.   Report any adverse side effects to outpatient provider. 3.  Patient instructed to not use alcohol or illegal drugs while on prescription medicines. 4.  In the event of worsening symptoms, instructed patient to call 911, the crisis hotline or go to nearest emergency room for evaluation of symptoms.  Signed: Lindwood Qua, NP [facility] 04/15/2016, 3:44 PM   Patient seen, Suicide Assessment Completed.  Disposition Plan Reviewed

## 2016-04-15 NOTE — Progress Notes (Signed)
  [facility] Adult Case Management Discharge Plan :  Will you be returning to the same living situation after discharge:  Yes,  home At discharge, [facility] Yes,  family Do you have the ability to pay for your medications: Yes MCR  Release of information consent forms completed and in the chart;  Patient's signature needed at discharge.  Patient to Follow up at: Follow-up Information    [facility] On 04/24/2016.   Why:  Wednesday at 10:30 with Ms Allena Katzatel in Mental health Intake.  After you are opened for services, [facility] if you [facility].   Contact information:   [address]  [facility] [phone]        Next level of care provider has access to [facility] Link:no  Safety Planning and Suicide Prevention discussed: Yes,  yes  Have you used any form of tobacco in the last 30 days? (Cigarettes, Smokeless Tobacco, Cigars, and/or Pipes): Yes  Has patient been referred to the Quitline?: Patient refused referral  Patient has been referred for addiction treatment: Yes  Daryel Geraldorth, Allante Whitmire B 04/15/2016, 10:25 AM

## 2016-04-15 NOTE — Progress Notes (Signed)
D. Pt has been up and visible in milieu, has participated in evening group activity. Pt received all bedtime medications without incident and spoke about how he is feeling good, spoke about how he slept well the night before and spoke briefly about how he believes he feels ready for discharge soon and did not verbalize any complaints of pain. A. Support and encouragement provided. R. Safety maintained, will continue to monitor.

## 2018-09-15 ENCOUNTER — Encounter: Payer: Self-pay | Admitting: Family

## 2018-09-15 ENCOUNTER — Other Ambulatory Visit (INDEPENDENT_AMBULATORY_CARE_PROVIDER_SITE_OTHER): Payer: No Typology Code available for payment source

## 2018-09-15 ENCOUNTER — Ambulatory Visit (INDEPENDENT_AMBULATORY_CARE_PROVIDER_SITE_OTHER): Payer: No Typology Code available for payment source | Admitting: Family

## 2018-09-15 VITALS — BP 158/106 | HR 69 | Temp 98.2°F | Ht 69.0 in | Wt 285.0 lb

## 2018-09-15 DIAGNOSIS — Z8042 Family history of malignant neoplasm of prostate: Secondary | ICD-10-CM | POA: Diagnosis not present

## 2018-09-15 DIAGNOSIS — Z23 Encounter for immunization: Secondary | ICD-10-CM

## 2018-09-15 DIAGNOSIS — Z0000 Encounter for general adult medical examination without abnormal findings: Secondary | ICD-10-CM

## 2018-09-15 DIAGNOSIS — Z13220 Encounter for screening for lipoid disorders: Secondary | ICD-10-CM | POA: Diagnosis not present

## 2018-09-15 DIAGNOSIS — M791 Myalgia, unspecified site: Secondary | ICD-10-CM | POA: Diagnosis not present

## 2018-09-15 DIAGNOSIS — Z0001 Encounter for general adult medical examination with abnormal findings: Secondary | ICD-10-CM

## 2018-09-15 DIAGNOSIS — G4730 Sleep apnea, unspecified: Secondary | ICD-10-CM | POA: Diagnosis not present

## 2018-09-15 LAB — CBC WITH DIFFERENTIAL/PLATELET
BASOS PCT: 0.3 % (ref 0.0–3.0)
Basophils Absolute: 0 10*3/uL (ref 0.0–0.1)
Eosinophils Absolute: 0.2 10*3/uL (ref 0.0–0.7)
Eosinophils Relative: 3.3 % (ref 0.0–5.0)
HCT: 44.8 % (ref 39.0–52.0)
Hemoglobin: 15.2 g/dL (ref 13.0–17.0)
LYMPHS ABS: 2.2 10*3/uL (ref 0.7–4.0)
Lymphocytes Relative: 38.4 % (ref 12.0–46.0)
MCHC: 33.8 g/dL (ref 30.0–36.0)
MCV: 93.4 fl (ref 78.0–100.0)
MONOS PCT: 10.6 % (ref 3.0–12.0)
Monocytes Absolute: 0.6 10*3/uL (ref 0.1–1.0)
NEUTROS ABS: 2.7 10*3/uL (ref 1.4–7.7)
Neutrophils Relative %: 47.4 % (ref 43.0–77.0)
PLATELETS: 189 10*3/uL (ref 150.0–400.0)
RBC: 4.8 Mil/uL (ref 4.22–5.81)
RDW: 14.7 % (ref 11.5–15.5)
WBC: 5.8 10*3/uL (ref 4.0–10.5)

## 2018-09-15 LAB — COMPREHENSIVE METABOLIC PANEL
ALK PHOS: 60 U/L (ref 39–117)
ALT: 28 U/L (ref 0–53)
AST: 20 U/L (ref 0–37)
Albumin: 4.6 g/dL (ref 3.5–5.2)
BUN: 12 mg/dL (ref 6–23)
CO2: 29 meq/L (ref 19–32)
Calcium: 9.7 mg/dL (ref 8.4–10.5)
Chloride: 103 mEq/L (ref 96–112)
Creatinine, Ser: 0.9 mg/dL (ref 0.40–1.50)
GFR: 122.64 mL/min (ref >60.00)
GLUCOSE: 90 mg/dL (ref 70–99)
POTASSIUM: 3.9 meq/L (ref 3.5–5.1)
SODIUM: 137 meq/L (ref 135–145)
TOTAL PROTEIN: 8.3 g/dL (ref 6.0–8.3)
Total Bilirubin: 0.3 mg/dL (ref 0.2–1.2)

## 2018-09-15 LAB — LIPID PANEL
CHOL/HDL RATIO: 5
Cholesterol: 194 mg/dL (ref 0–200)
HDL: 35.4 mg/dL — ABNORMAL LOW (ref >39.00)
LDL Cholesterol: 125 mg/dL — ABNORMAL HIGH (ref 0–99)
NONHDL: 158.23
Triglycerides: 165 mg/dL — ABNORMAL HIGH (ref 0.0–149.0)
VLDL: 33 mg/dL (ref 0.0–40.0)

## 2018-09-15 LAB — VITAMIN D 25 HYDROXY (VIT D DEFICIENCY, FRACTURES): VITD: 15.97 ng/mL — AB (ref 30.00–100.00)

## 2018-09-15 LAB — PSA: PSA: 0.36 ng/mL (ref 0.10–4.00)

## 2018-09-15 LAB — TSH: TSH: 1.18 u[IU]/mL (ref 0.35–4.50)

## 2018-09-15 MED ORDER — AMLODIPINE BESYLATE 5 MG PO TABS: 5.0000 mg | ORAL_TABLET | Freq: Every day | ORAL | 0 refills | Status: DC

## 2018-09-15 NOTE — Progress Notes (Signed)
Robert Kelly is a 37 y.o. male with the following history as recorded in EpicCare:  Patient Active Problem List   Diagnosis Date Noted  . Schizoaffective disorder, bipolar type ([place]) 04/11/2016  . Cannabis use disorder, severe, dependence ([place]) 04/11/2016  . Obstructive sleep apnea 04/11/2016  . Chronic post-traumatic stress disorder (PTSD) 04/10/2016    Current Outpatient Medications  Medication Sig Dispense Refill  . amLODipine (NORVASC) 5 MG tablet Take 1 tablet (5 mg total) by mouth daily. 90 tablet 0   No current facility-administered medications for this visit.     Allergies: Shellfish allergy  Past Medical History:  Diagnosis Date  . PTSD (post-traumatic stress disorder)     History reviewed. No pertinent surgical history.  Family History  Problem Relation Age of Onset  . Alcohol abuse Father   . Diabetes Mother   . Osteoarthritis Mother   . Asthma Mother   . Hypertension Mother   . Kidney disease Mother   . Alcohol abuse Paternal Grandfather     Social History   Tobacco Use  . Smoking status: Former Smoker    Last attempt to quit: 07/03/2015    Years since quitting: 3.2  . Smokeless tobacco: Never Used  Substance Use Topics  . Alcohol use: Yes    Comment: Social    Subjective:  Patient presents today as a new patient today; difficult historian/ difficult to assess what he needs today; Has majority of his healthcare needs managed through the [place] including primary care and psychiatrist; Sees psychiatrist at [facility] monthly for; was told by his last primary care provider in the [place] that he did need to be on medication- has not been there in about a year. Feels that his high blood pressure is caused by his anxiety.   Admits that he smokes marijuana daily for his anxiety; has opted not to take medication prescribed by his psychiatrist. Mentions that he is in chronic pain especially when he wakes up in the am;  Has CPAP- managed through [place] at [place] but would  prefer to change to local provider.   Has 9 children- 15, 14, 13, 12, 10, 9, 6, 3, 1 ( 5 girls, 4 boys)  Up to date on eye exam; needs to establish with dentist  Review of Systems  Constitutional: Negative for malaise/fatigue.  HENT: Negative for congestion and hearing loss.   Eyes: Negative for blurred vision.  Respiratory: Negative for cough and shortness of breath.   Cardiovascular: Negative for chest pain and palpitations.  Gastrointestinal: Negative for constipation, diarrhea and heartburn.  Genitourinary: Negative.   Musculoskeletal: Positive for myalgias.  Skin: Negative for rash.  Neurological: Negative for dizziness and headaches.  Psychiatric/Behavioral: Positive for hallucinations. The patient has insomnia.        Known schizoaffective disorder; PTSD       Objective:  Vitals:   09/15/18 1112  BP: (!) 158/106  Pulse: 69  Temp: 98.2 F (36.8 C)  TempSrc: Oral  SpO2: 98%  Weight: 285 lb (129.3 kg)  Height: _0  (1.753 m)    General: Well developed, well nourished, in no acute distress  Skin : Warm and dry.  Head: Normocephalic and atraumatic  Eyes: Sclera and conjunctiva clear; pupils round and reactive to light; extraocular movements intact  Ears: External normal; canals clear; tympanic membranes normal  Oropharynx: Pink, supple. No suspicious lesions  Neck: Supple without thyromegaly, adenopathy  Lungs: Respirations unlabored; clear to auscultation bilaterally without wheeze, rales, rhonchi  CVS exam:  normal rate and regular rhythm.  Abdomen: Soft; nontender; nondistended; normoactive bowel sounds; no masses or hepatosplenomegaly  Musculoskeletal: No deformities; no active joint inflammation  Extremities: No edema, cyanosis, clubbing  Vessels: Symmetric bilaterally  Neurologic: Alert and oriented; speech intact; face symmetrical; moves all extremities well; CNII-XII intact without focal deficit   Assessment:  1. PE (physical exam), annual   2. Sleep  apnea, unspecified type   3. Lipid screening   4. Myalgia   5. FH: prostate cancer     Plan:  Age appropriate preventive healthcare needs addressed; encouraged regular eye doctor and dental exams; encouraged regular exercise; will update labs and refills as needed today; follow-up to be determined; Trial of Amlodipine 5 mg daily for blood pressure; will refer to neurology to update sleep study/ may need new CPAP; Flu and Tdap given today; Follow-up in 1 month, sooner prn. He will plan to keep his psych care managed through [place].   No follow-ups on file.  Orders Placed This Encounter  Procedures  . CBC w/Diff    Standing Status:   Future    Standing Expiration Date:   09/15/2019  . Comp Met (CMET)    Standing Status:   Future    Standing Expiration Date:   09/15/2019  . Lipid panel    Standing Status:   Future    Standing Expiration Date:   09/16/2019  . HIV Antibody (routine testing w rflx)    Standing Status:   Future    Standing Expiration Date:   09/16/2019  . TSH    Standing Status:   Future    Standing Expiration Date:   09/15/2019  . Vitamin D (25 hydroxy)    Standing Status:   Future    Standing Expiration Date:   09/15/2019  . PSA    Standing Status:   Future    Standing Expiration Date:   09/15/2019  . Ambulatory referral to Neurology    Referral Priority:   Routine    Referral Type:   Consultation    Referral Reason:   Specialty Services Required    Requested Specialty:   Neurology    Number of Visits Requested:   1    Requested Prescriptions   Signed Prescriptions Disp Refills  . amLODipine (NORVASC) 5 MG tablet 90 tablet 0    Sig: Take 1 tablet (5 mg total) by mouth daily.

## 2018-09-16 LAB — HIV ANTIBODY (ROUTINE TESTING W REFLEX): HIV: NONREACTIVE

## 2018-09-17 ENCOUNTER — Other Ambulatory Visit: Payer: Self-pay | Admitting: Family

## 2018-09-17 MED ORDER — VITAMIN D (ERGOCALCIFEROL) 1.25 MG (50000 UNIT) PO CAPS: 50000.0000 [IU] | ORAL_CAPSULE | ORAL | 0 refills | Status: AC

## 2018-10-16 ENCOUNTER — Ambulatory Visit: Payer: Self-pay | Admitting: Family

## 2018-10-16 DIAGNOSIS — Z0289 Encounter for other administrative examinations: Secondary | ICD-10-CM

## 2018-11-09 ENCOUNTER — Ambulatory Visit (INDEPENDENT_AMBULATORY_CARE_PROVIDER_SITE_OTHER): Payer: Self-pay | Admitting: Neurology

## 2018-11-09 ENCOUNTER — Telehealth: Payer: Self-pay | Admitting: Neurology

## 2018-11-09 ENCOUNTER — Encounter: Payer: Self-pay | Admitting: Neurology

## 2018-11-09 VITALS — Ht 69.0 in

## 2018-11-09 DIAGNOSIS — H527 Unspecified disorder of refraction: Secondary | ICD-10-CM

## 2018-11-09 NOTE — Telephone Encounter (Signed)
Pt presented today for apt but [facility] Unable to be seen today. Will be rescheduled.

## 2018-11-10 ENCOUNTER — Ambulatory Visit: Payer: Self-pay | Admitting: Family

## 2018-11-18 NOTE — Patient Instructions (Signed)
Error

## 2018-11-20 ENCOUNTER — Telehealth: Payer: Self-pay

## 2018-11-20 NOTE — Telephone Encounter (Signed)
I called and left message but number given isn't Kim's voicemail. Just left message that I was calling back regarding patient and for them to return call to clinic to see what was needed as we have only seen patient once and it appeared [facility] [place] [facility] [place] [facility] [place] as he has not returned to clinic to follow up as he was suppose to.

## 2018-12-28 ENCOUNTER — Institutional Professional Consult (permissible substitution): Payer: Non-veteran care | Admitting: Neurology

## 2020-05-18 ENCOUNTER — Ambulatory Visit: Payer: Non-veteran care | Attending: Internal Medicine

## 2020-05-18 DIAGNOSIS — Z23 Encounter for immunization: Secondary | ICD-10-CM

## 2020-05-18 NOTE — Progress Notes (Signed)
   Covid-19 Vaccination Clinic  Name:  Robert Kelly    MRN: [id number] DOB: [birth year]  05/18/2020  [name] was observed post Covid-19 immunization for 30 minutes based on pre-vaccination screening without incident. He was provided with Vaccine Information Sheet and instruction to access the V-Safe system.   [name] was instructed to call 911 with any severe reactions post vaccine: Marland Kitchen Difficulty breathing  . Swelling of face and throat  . A fast heartbeat  . A bad rash all over body  . Dizziness and weakness   Immunizations Administered    Name Date Dose VIS Date Route   Pfizer COVID-19 Vaccine 05/18/2020 12:59 PM 0.3 mL 01/26/2019 Intramuscular   Manufacturer: [employer], [employer]   Lot: [id number]   NDC: [address]-7

## 2020-07-21 ENCOUNTER — Other Ambulatory Visit: Payer: Self-pay

## 2020-07-21 ENCOUNTER — Other Ambulatory Visit: Payer: Non-veteran care

## 2020-07-21 DIAGNOSIS — Z20822 Contact with and (suspected) exposure to covid-19: Secondary | ICD-10-CM

## 2020-07-22 LAB — SPECIMEN STATUS REPORT

## 2020-07-22 LAB — NOVEL CORONAVIRUS, NAA: SARS-CoV-2, NAA: DETECTED — AB

## 2020-07-22 LAB — SARS-COV-2, NAA 2 DAY TAT

## 2020-07-23 ENCOUNTER — Telehealth: Payer: Self-pay | Admitting: Nurse Practitioner

## 2020-07-23 NOTE — Telephone Encounter (Addendum)
Called to discuss with patient about Covid symptoms and the use of casirivimab/imdevimab, a combination monoclonal antibody infusion for those with mild to moderate Covid symptoms and at a high risk of hospitalization.    Will need to speak with patient further to determine eligibility (symptoms and co-morbidities). Unable to reach. Voicemail left.  Nikki Pickenpack-Cousar, AGPCNP-BC 

## 2020-08-02 ENCOUNTER — Other Ambulatory Visit: Payer: Self-pay

## 2020-08-02 ENCOUNTER — Other Ambulatory Visit: Payer: Non-veteran care

## 2020-08-02 DIAGNOSIS — Z20822 Contact with and (suspected) exposure to covid-19: Secondary | ICD-10-CM

## 2020-08-04 LAB — NOVEL CORONAVIRUS, NAA: SARS-CoV-2, NAA: NOT DETECTED

## 2020-11-15 ENCOUNTER — Encounter: Payer: Self-pay | Admitting: Neurology

## 2021-07-20 LAB — GLUCOSE, POCT (MANUAL RESULT ENTRY): POC Glucose: 100 mg/dl — AB (ref 70–99)

## 2022-03-28 ENCOUNTER — Ambulatory Visit (INDEPENDENT_AMBULATORY_CARE_PROVIDER_SITE_OTHER): Payer: Non-veteran care

## 2022-03-28 ENCOUNTER — Ambulatory Visit (HOSPITAL_COMMUNITY)
Admission: EM | Admit: 2022-03-28 | Discharge: 2022-03-28 | Disposition: A | Discharge status: Home / Self Care | Payer: Non-veteran care | Attending: Physician Assistant | Admitting: Physician Assistant

## 2022-03-28 ENCOUNTER — Encounter (HOSPITAL_COMMUNITY): Payer: Self-pay

## 2022-03-28 DIAGNOSIS — R030 Elevated blood-pressure reading, without diagnosis of hypertension: Secondary | ICD-10-CM | POA: Diagnosis not present

## 2022-03-28 DIAGNOSIS — L089 Local infection of the skin and subcutaneous tissue, unspecified: Secondary | ICD-10-CM | POA: Diagnosis not present

## 2022-03-28 DIAGNOSIS — M79644 Pain in right finger(s): Secondary | ICD-10-CM

## 2022-03-28 MED ORDER — CEPHALEXIN 500 MG PO CAPS: 500.0000 mg | ORAL_CAPSULE | Freq: Four times a day (QID) | ORAL | 0 refills | Status: AC

## 2022-03-28 MED ORDER — CEFTRIAXONE SODIUM 1 G IJ SOLR
INTRAMUSCULAR | Status: AC
Start: 2022-03-28 — End: ?
  Filled 2022-03-28: qty 10

## 2022-03-28 MED ORDER — AMLODIPINE BESYLATE 5 MG PO TABS: 5.0000 mg | ORAL_TABLET | Freq: Every day | ORAL | 0 refills | Status: AC

## 2022-03-28 MED ORDER — LIDOCAINE HCL (PF) 1 % IJ SOLN
INTRAMUSCULAR | Status: AC
  Filled 2022-03-28: qty 2

## 2022-03-28 MED ORDER — CEFTRIAXONE SODIUM 1 G IJ SOLR
1.0000 g | Freq: Once | INTRAMUSCULAR | Status: AC
  Administered 2022-03-28: 1 g via INTRAMUSCULAR

## 2022-03-28 MED ORDER — SULFAMETHOXAZOLE-TRIMETHOPRIM 800-160 MG PO TABS: 1.0000 | ORAL_TABLET | Freq: Two times a day (BID) | ORAL | 0 refills | Status: AC

## 2022-03-28 NOTE — Discharge Instructions (Signed)
Please start antibiotics as we discussed.  Follow-up with hand specialist; call to schedule appointment soon as possible.  If you have any worsening symptoms including increased pain, swelling, fever, nausea, vomiting, discoloration of the finger you need to go to the emergency room. ? ?Your blood pressure is very elevated.  Start amlodipine 5 mg.  Follow-up with your PCP or our clinic within a week for blood pressure recheck.  If you develop any chest pain, shortness of breath, headache, vision change in the setting of high blood pressure you need to go to the emergency room. ?

## 2022-03-28 NOTE — ED Triage Notes (Signed)
Pt states he jammed his rt finger and burned it 1 week ago. States it is swollen and burn is not healing. ?

## 2022-03-28 NOTE — ED Provider Notes (Signed)
?[facility] ? ? ? ?CSN: [id number] ?Arrival date & time: 03/28/22  [id number] ? ? ?  ? ?History   ?Chief Complaint ?Chief Complaint  ?Patient presents with  ? Finger Injury  ? ? ?HPI ?Robert Kelly is a 41 y.o. male.  ? ?Patient was today with a 1 week history of right middle finger pain.  He reports that he burned his finger and then jammed it.  Since that time he has had ongoing pain and swelling.  Pain is rated 9 on a 0-10 pain scale, described as throbbing, worse with palpation, no alleviating factors identified.  He has tried over-the-counter medications without improvement of symptoms.  Reports that he has been able to drain purulent fluid from distal nailbed.  He is right-handed.  Denies any numbness or paresthesias.  Denies any systemic symptoms.  He denies history of diabetes or immunosuppression.  Denies history of MRSA or recurrent skin infections.  Denies any recent antibiotic use. ? ? ?Past Medical History:  ?Diagnosis Date  ? PTSD (post-traumatic stress disorder)   ? ? ?Patient Active Problem List  ? Diagnosis Date Noted  ? Schizoaffective disorder, bipolar type (HCC) 04/11/2016  ? Cannabis use disorder, severe, dependence (HCC) 04/11/2016  ? Obstructive sleep apnea 04/11/2016  ? Chronic post-traumatic stress disorder (PTSD) 04/10/2016  ? ? ?History reviewed. No pertinent surgical history. ? ? ? ? ?Home Medications   ? ?Prior to Admission medications   ?Medication Sig Start Date End Date Taking? Authorizing Provider  ?amLODipine (NORVASC) 5 MG tablet Take 1 tablet (5 mg total) by mouth daily. 03/28/22  Yes Kionte Baumgardner, Noberto RetortErin K, PA-C  ?cephALEXin (KEFLEX) 500 MG capsule Take 1 capsule (500 mg total) by mouth 4 (four) times daily for 10 days. 03/28/22 04/07/22 Yes Thaddius Manes K, PA-C  ?sulfamethoxazole-trimethoprim (BACTRIM DS) 800-160 MG tablet Take 1 tablet by mouth 2 (two) times daily for 10 days. 03/28/22 04/07/22 Yes Sharisa Toves, Noberto RetortErin K, PA-C  ? ? ?Family History ?Family History  ?Problem Relation Age of Onset   ? Alcohol abuse Father   ? Diabetes Mother   ? Osteoarthritis Mother   ? Asthma Mother   ? Hypertension Mother   ? Kidney disease Mother   ? Alcohol abuse Paternal Grandfather   ? ? ?Social History ?Social History  ? ?Tobacco Use  ? Smoking status: Former  ?  Types: Cigarettes  ?  Quit date: 07/03/2015  ?  Years since quitting: 6.7  ? Smokeless tobacco: Never  ?Substance Use Topics  ? Alcohol use: Yes  ?  Comment: Social  ? Drug use: Yes  ?  Types: Marijuana  ?  Comment: Daily  ? ? ? ?Allergies   ?Shellfish allergy ? ? ?Review of Systems ?Review of Systems  ?Constitutional:  Positive for activity change. Negative for appetite change, fatigue and fever.  ?Musculoskeletal:  Positive for arthralgias and myalgias.  ?Skin:  Positive for wound.  ?Neurological:  Negative for dizziness, weakness, light-headedness, numbness and headaches.  ? ? ?Physical Exam ?Triage Vital Signs ?ED Triage Vitals  ?Enc Vitals Group  ?   BP 03/28/22 1052 (!) 192/109  ?   Pulse Rate 03/28/22 1052 63  ?   Resp 03/28/22 1052 18  ?   Temp 03/28/22 1052 97.9 ?F (36.6 ?C)  ?   Temp Source 03/28/22 1052 Oral  ?   SpO2 03/28/22 1052 96 %  ?   Weight --   ?   Height --   ?   Head  Circumference --   ?   Peak Flow --   ?   Pain Score 03/28/22 1053 9  ?   Pain Loc --   ?   Pain Edu? --   ?   Excl. in [place]? --   ? ?No data found. ? ?Updated Vital Signs ?BP (!) 170/90 (BP Location: Left Arm)   Pulse 63   Temp 97.9 ?F (36.6 ?C) (Oral)   Resp 18   SpO2 96%  ? ?Visual Acuity ?Right Eye Distance:   ?Left Eye Distance:   ?Bilateral Distance:   ? ?Right Eye Near:   ?Left Eye Near:    ?Bilateral Near:    ? ?Physical Exam ?Vitals reviewed.  ?Constitutional:   ?   General: He is awake.  ?   Appearance: Normal appearance. He is well-developed. He is not ill-appearing.  ?   Comments: Very pleasant male appears stated age in no acute distress sitting comfortably on exam table  ?HENT:  ?   Head: Normocephalic and atraumatic.  ?   Mouth/Throat:  ?   Pharynx: No  oropharyngeal exudate, posterior oropharyngeal erythema or uvula swelling.  ?Cardiovascular:  ?   Rate and Rhythm: Normal rate and regular rhythm.  ?   Heart sounds: Normal heart sounds, S1 normal and S2 normal. No murmur heard. ?Pulmonary:  ?   Effort: Pulmonary effort is normal.  ?   Breath sounds: Normal breath sounds. No stridor. No wheezing, rhonchi or rales.  ?   Comments: Clear to auscultation bilaterally ?Musculoskeletal:  ?   Right hand: Swelling, tenderness and bony tenderness present. Decreased range of motion.  ?   Comments: Right middle finger: Significant swelling with purulent drainage noted under nail bed.  ?Neurological:  ?   Mental Status: He is alert.  ?Psychiatric:     ?   Behavior: Behavior is cooperative.  ? ? ? ?UC Treatments / Results  ?Labs ?(all labs ordered are listed, but only abnormal results are displayed) ?Labs Reviewed - No data to display ? ?EKG ? ? ?Radiology ?DG Finger Middle Right ? ?Result Date: 03/28/2022 ?CLINICAL DATA:  Right middle finger pain and swelling after injury 1 week ago. EXAM: RIGHT MIDDLE FINGER 2+V COMPARISON:  None. FINDINGS: There is no evidence of fracture or dislocation. There is no evidence of arthropathy or other focal bone abnormality. Soft tissues are unremarkable. IMPRESSION: Negative. Electronically Signed   By: Lupita Raider M.D.   On: 03/28/2022 11:22   ? ?Procedures ?Procedures (including critical care time) ? ?Medications Ordered in UC ?Medications  ?cefTRIAXone (ROCEPHIN) injection 1 g (1 g Intramuscular Given 03/28/22 1148)  ? ? ?Initial Impression / Assessment and Plan / UC Course  ?I have reviewed the triage vital signs and the nursing notes. ? ?Pertinent labs & imaging results that were available during my care of the patient were reviewed by me and considered in my medical decision making (see chart for details). ? ?  ? ?Concern for felon.  X-ray was obtained that showed no evidence of osteomyelitis.  Patient was given Rocephin in clinic and  started on Keflex and Bactrim DS.  Recommended follow-up with hand specialist and was given contact information for local provider with instruction to call to schedule an appointment.  He can use Tylenol for pain.  Recommended that he contact his specialist and schedule appointment soon as possible.  Discussed that if he has any worsening symptoms he is to return for reevaluation immediately.  Strict return precautions given  to which he expressed understanding. ? ?Blood pressure is elevated today.  Patient denies any signs/symptoms of endorgan damage.  He was instructed to start amlodipine 5 mg.  He is to follow-up with either our clinic or primary care within a few weeks.  Discussed that if he develops any chest pain, shortness of breath, headache, vision change, dizziness in setting of high blood pressure he needs to go to the emergency room to which she expressed understanding. ? ?Final Clinical Impressions(s) / UC Diagnoses  ? ?Final diagnoses:  ?Finger infection  ?Elevated blood pressure reading  ? ? ? ?Discharge Instructions   ? ?  ?Please start antibiotics as we discussed.  Follow-up with hand specialist; call to schedule appointment soon as possible.  If you have any worsening symptoms including increased pain, swelling, fever, nausea, vomiting, discoloration of the finger you need to go to the emergency room. ? ?Your blood pressure is very elevated.  Start amlodipine 5 mg.  Follow-up with your PCP or our clinic within a week for blood pressure recheck.  If you develop any chest pain, shortness of breath, headache, vision change in the setting of high blood pressure you need to go to the emergency room. ? ? ? ? ?ED Prescriptions   ? ? Medication Sig Dispense Auth. Provider  ? cephALEXin (KEFLEX) 500 MG capsule Take 1 capsule (500 mg total) by mouth 4 (four) times daily for 10 days. 40 capsule Nolton Denis K, PA-C  ? sulfamethoxazole-trimethoprim (BACTRIM DS) 800-160 MG tablet Take 1 tablet by mouth 2 (two)  times daily for 10 days. 20 tablet Jaelen Soth K, PA-C  ? amLODipine (NORVASC) 5 MG tablet Take 1 tablet (5 mg total) by mouth daily. 30 tablet Sandia Pfund K, PA-C  ? ?  ? ?PDMP not reviewed this encounter. ?  ?Ra

## 2023-01-21 ENCOUNTER — Ambulatory Visit
Admission: EM | Admit: 2023-01-21 | Discharge: 2023-01-21 | Disposition: A | Discharge status: Home / Self Care | Payer: No Typology Code available for payment source | Attending: Physician Assistant | Admitting: Physician Assistant

## 2023-01-21 ENCOUNTER — Other Ambulatory Visit: Payer: Self-pay

## 2023-01-21 ENCOUNTER — Encounter: Payer: Self-pay | Admitting: Emergency Medicine

## 2023-01-21 DIAGNOSIS — Z113 Encounter for screening for infections with a predominantly sexual mode of transmission: Secondary | ICD-10-CM

## 2023-01-21 NOTE — Discharge Instructions (Signed)
Testing will be completed in 48 hours.  If you do not get a call from this office within 48 hours that indicates the test is negative.  Log onto MyChart to be the test results with the post in 48 hours.  Advised follow-up PCP or return to urgent care if symptoms fail to improve.

## 2023-01-21 NOTE — ED Triage Notes (Signed)
Pt here for STD testing; denies sx

## 2023-01-21 NOTE — ED Provider Notes (Signed)
[facility]    CSN: [id number] Arrival date & time: 01/21/23  1135      History   Chief Complaint Chief Complaint  Patient presents with   Exposure to STD    HPI FONZIE ASARE is a 42 y.o. male.   42 year old male presents late for STI testing.  Patient indicates that he is here to have yearly testing for HIV and RPR.  He is also desiring STI screening.  He is currently without symptoms, denies frequency, urgency, dysuria.  He indicates he does not have any penile discharge.  He is without fever or chills.   Exposure to STD    Past Medical History:  Diagnosis Date   PTSD (post-traumatic stress disorder)     Patient Active Problem List   Diagnosis Date Noted   Schizoaffective disorder, bipolar type ([place]) 04/11/2016   Cannabis use disorder, severe, dependence ([place]) 04/11/2016   Obstructive sleep apnea 04/11/2016   Chronic post-traumatic stress disorder (PTSD) 04/10/2016    History reviewed. No pertinent surgical history.     Home Medications    Prior to Admission medications   Medication Sig Start Date End Date Taking? Authorizing Provider  amLODipine (NORVASC) 5 MG tablet Take 1 tablet (5 mg total) by mouth daily. 03/28/22   Raspet, Derry Skill, PA-C    Family History Family History  Problem Relation Age of Onset   Alcohol abuse Father    Diabetes Mother    Osteoarthritis Mother    Asthma Mother    Hypertension Mother    Kidney disease Mother    Alcohol abuse Paternal Grandfather     Social History Social History   Tobacco Use   Smoking status: Former    Types: Cigarettes    Quit date: 07/03/2015    Years since quitting: 7.5   Smokeless tobacco: Never  Substance Use Topics   Alcohol use: Yes    Comment: Social   Drug use: Yes    Types: Marijuana    Comment: Daily     Allergies   Shellfish allergy   Review of Systems Review of Systems   Physical Exam Triage Vital Signs ED Triage Vitals  Enc Vitals Group     BP 01/21/23  1220 (!) 163/98     Pulse Rate 01/21/23 1220 (!) 58     Resp 01/21/23 1220 18     Temp 01/21/23 1220 98.2 F (36.8 C)     Temp Source 01/21/23 1220 Oral     SpO2 01/21/23 1220 95 %     Weight --      Height --      Head Circumference --      Peak Flow --      Pain Score 01/21/23 1221 0     Pain Loc --      Pain Edu? --      Excl. in [place]? --    No data found.  Updated Vital Signs BP (!) 163/98 (BP Location: Right Arm)   Pulse (!) 58   Temp 98.2 F (36.8 C) (Oral)   Resp 18   SpO2 95%   Visual Acuity Right Eye Distance:   Left Eye Distance:   Bilateral Distance:    Right Eye Near:   Left Eye Near:    Bilateral Near:     Physical Exam Constitutional:      Appearance: Normal appearance.  Neurological:     Mental Status: He is alert.      UC Treatments /  Results  Labs (all labs ordered are listed, but only abnormal results are displayed) Labs Reviewed  HIV ANTIBODY (ROUTINE TESTING W REFLEX)  RPR  CYTOLOGY, (ORAL, ANAL, URETHRAL) ANCILLARY ONLY    EKG   Radiology No results found.  Procedures Procedures (including critical care time)  Medications Ordered in UC Medications - No data to display  Initial Impression / Assessment and Plan / UC Course  I have reviewed the triage vital signs and the nursing notes.  Pertinent labs & imaging results that were available during my care of the patient were reviewed by me and considered in my medical decision making (see chart for details).    Plan: Diagnosis will be treated with the following: 1.  Screening for STI: A.  STI lab testing results are pending to include screening for HIV and RPR. D.  Treatment may be initiated depending on testing results. 2.  Advised follow-up PCP or return to urgent care as needed. Final Clinical Impressions(s) / UC Diagnoses   Final diagnoses:  Routine screening for STI (sexually transmitted infection)     Discharge Instructions      Testing will be completed in 48  hours.  If you do not get a call from this office within 48 hours that indicates the test is negative.  Log onto MyChart to be the test results with the post in 48 hours.  Advised follow-up PCP or return to urgent care if symptoms fail to improve.    ED Prescriptions   None    PDMP not reviewed this encounter.   Nyoka Lint, PA-C 01/21/23 1233

## 2023-01-22 ENCOUNTER — Telehealth (HOSPITAL_COMMUNITY): Payer: Self-pay | Admitting: Emergency Medicine

## 2023-01-22 LAB — CYTOLOGY, (ORAL, ANAL, URETHRAL) ANCILLARY ONLY
Chlamydia: NEGATIVE
Comment: NEGATIVE
Comment: NEGATIVE
Comment: NORMAL
Neisseria Gonorrhea: NEGATIVE
Trichomonas: POSITIVE — AB

## 2023-01-22 LAB — HIV ANTIBODY (ROUTINE TESTING W REFLEX): HIV Screen 4th Generation wRfx: NONREACTIVE

## 2023-01-22 LAB — RPR: RPR Ser Ql: NONREACTIVE

## 2023-01-22 MED ORDER — METRONIDAZOLE 500 MG PO TABS: 2000.0000 mg | ORAL_TABLET | Freq: Once | ORAL | 0 refills | Status: AC

## 2023-05-06 ENCOUNTER — Ambulatory Visit
Admission: EM | Admit: 2023-05-06 | Discharge: 2023-05-06 | Disposition: A | Discharge status: Home / Self Care | Payer: No Typology Code available for payment source | Attending: Family Medicine | Admitting: Family Medicine

## 2023-05-06 ENCOUNTER — Other Ambulatory Visit: Payer: Self-pay

## 2023-05-06 ENCOUNTER — Encounter: Payer: Self-pay | Admitting: Emergency Medicine

## 2023-05-06 DIAGNOSIS — Z202 Contact with and (suspected) exposure to infections with a predominantly sexual mode of transmission: Secondary | ICD-10-CM | POA: Diagnosis present

## 2023-05-06 NOTE — ED Triage Notes (Signed)
Pt here STD testing; denies sx

## 2023-05-06 NOTE — Discharge Instructions (Signed)
Staff will notify you of any positive tests on the swab 

## 2023-05-06 NOTE — ED Provider Notes (Signed)
[facility]    CSN: [id number] Arrival date & time: 05/06/23  1239      History   Chief Complaint Chief Complaint  Patient presents with   Exposure to STD    HPI Robert Kelly is a 42 y.o. male.    Exposure to STD   Here for screening for STDs.  He does not have any dysuria, penile discharge, or itching.   Past Medical History:  Diagnosis Date   PTSD (post-traumatic stress disorder)     Patient Active Problem List   Diagnosis Date Noted   Schizoaffective disorder, bipolar type (HCC) 04/11/2016   Cannabis use disorder, severe, dependence (HCC) 04/11/2016   Obstructive sleep apnea 04/11/2016   Chronic post-traumatic stress disorder (PTSD) 04/10/2016    History reviewed. No pertinent surgical history.     Home Medications    Prior to Admission medications   Medication Sig Start Date End Date Taking? Authorizing Provider  amLODipine (NORVASC) 5 MG tablet Take 1 tablet (5 mg total) by mouth daily. 03/28/22   Raspet, Noberto Retort, PA-C    Family History Family History  Problem Relation Age of Onset   Alcohol abuse Father    Diabetes Mother    Osteoarthritis Mother    Asthma Mother    Hypertension Mother    Kidney disease Mother    Alcohol abuse Paternal Grandfather     Social History Social History   Tobacco Use   Smoking status: Former    Types: Cigarettes    Quit date: 07/03/2015    Years since quitting: 7.8   Smokeless tobacco: Never  Substance Use Topics   Alcohol use: Yes    Comment: Social   Drug use: Yes    Types: Marijuana    Comment: Daily     Allergies   Shellfish allergy   Review of Systems Review of Systems   Physical Exam Triage Vital Signs ED Triage Vitals  Enc Vitals Group     BP 05/06/23 1254 (!) 150/92     Pulse Rate 05/06/23 1254 (!) 56     Resp 05/06/23 1254 18     Temp 05/06/23 1254 98.1 F (36.7 C)     Temp Source 05/06/23 1254 Oral     SpO2 05/06/23 1254 97 %     Weight --      Height --      Head  Circumference --      Peak Flow --      Pain Score 05/06/23 1255 0     Pain Loc --      Pain Edu? --      Excl. in [place]? --    No data found.  Updated Vital Signs BP (!) 150/92 (BP Location: Left Arm)   Pulse (!) 56   Temp 98.1 F (36.7 C) (Oral)   Resp 18   SpO2 97%   Visual Acuity Right Eye Distance:   Left Eye Distance:   Bilateral Distance:    Right Eye Near:   Left Eye Near:    Bilateral Near:     Physical Exam Vitals reviewed.  Constitutional:      General: He is not in acute distress.    Appearance: He is not toxic-appearing.  Skin:    Coloration: Skin is not pale.  Neurological:     Mental Status: He is alert and oriented to person, place, and time.  Psychiatric:        Behavior: Behavior normal.  UC Treatments / Results  Labs (all labs ordered are listed, but only abnormal results are displayed) Labs Reviewed  CYTOLOGY, (ORAL, ANAL, URETHRAL) ANCILLARY ONLY    EKG   Radiology No results found.  Procedures Procedures (including critical care time)  Medications Ordered in UC Medications - No data to display  Initial Impression / Assessment and Plan / UC Course  I have reviewed the triage vital signs and the nursing notes.  Pertinent labs & imaging results that were available during my care of the patient were reviewed by me and considered in my medical decision making (see chart for details).        He declines my offer of HIV and RPR screening. Urethral self swab is done, and we will notify him of any positive tests and treat per protocol Final Clinical Impressions(s) / UC Diagnoses   Final diagnoses:  Exposure to STD     Discharge Instructions      Staff will notify you of any positive tests on the swab      ED Prescriptions   None    PDMP not reviewed this encounter.   Zenia Resides, MD 05/06/23 1311

## 2023-05-07 LAB — CYTOLOGY, (ORAL, ANAL, URETHRAL) ANCILLARY ONLY
Chlamydia: NEGATIVE
Comment: NEGATIVE
Comment: NEGATIVE
Comment: NORMAL
Neisseria Gonorrhea: NEGATIVE

## 2023-05-08 ENCOUNTER — Telehealth (HOSPITAL_COMMUNITY): Payer: Self-pay | Admitting: Emergency Medicine

## 2023-05-08 NOTE — Telephone Encounter (Signed)
Patient will need to return for recollect on cyto for Trichomonas, insufficient material Results seen on mychart Reviewed with patient, he will return in the next 72 hours for recollect.

## 2023-07-25 IMAGING — DX DG FINGER MIDDLE 2+V*R*
3 series · 3 of 3 positions shown · non-contrast
Comparison: None.

CLINICAL DATA: Right middle finger pain and swelling after injury 1
week ago.

EXAM:
RIGHT MIDDLE FINGER 2+V

[finger ap]
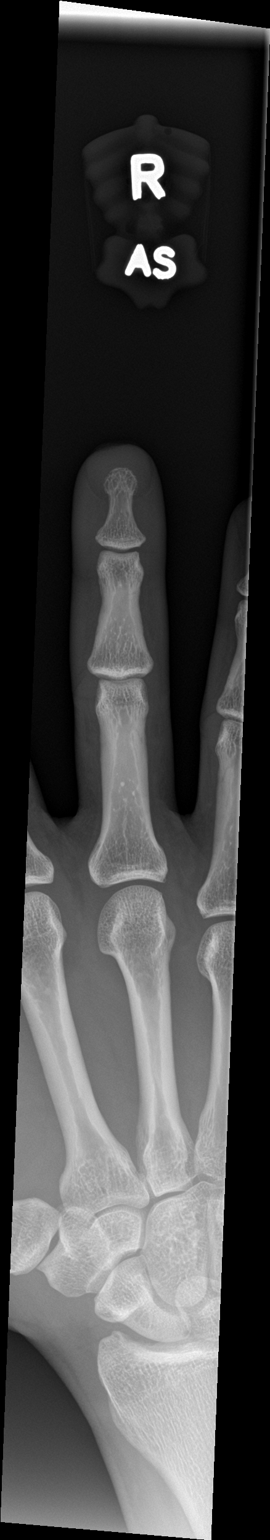

[finger obl]
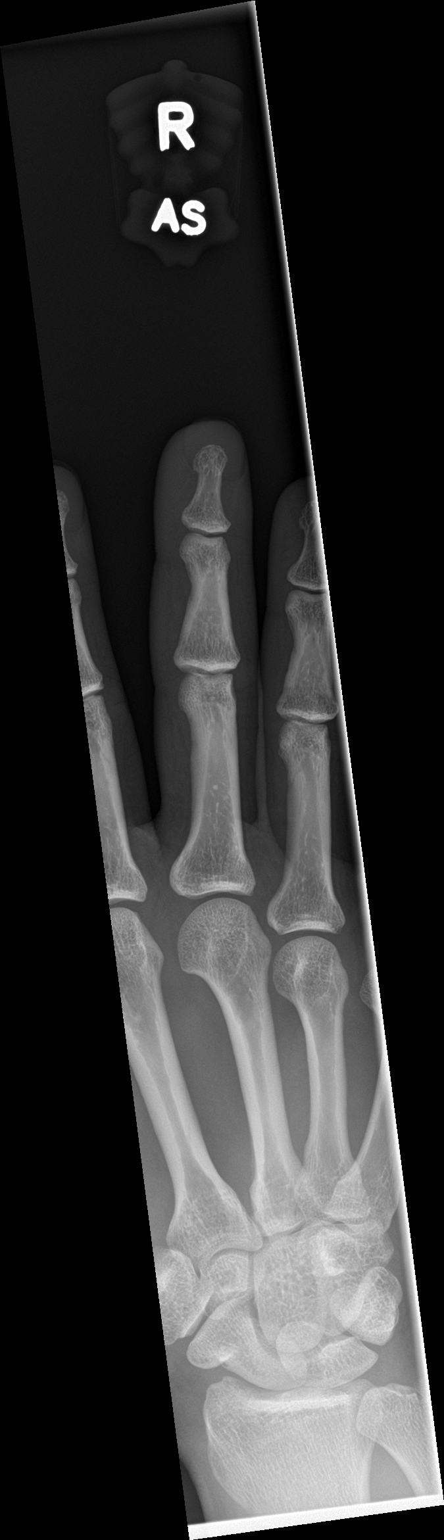

[finger lat]
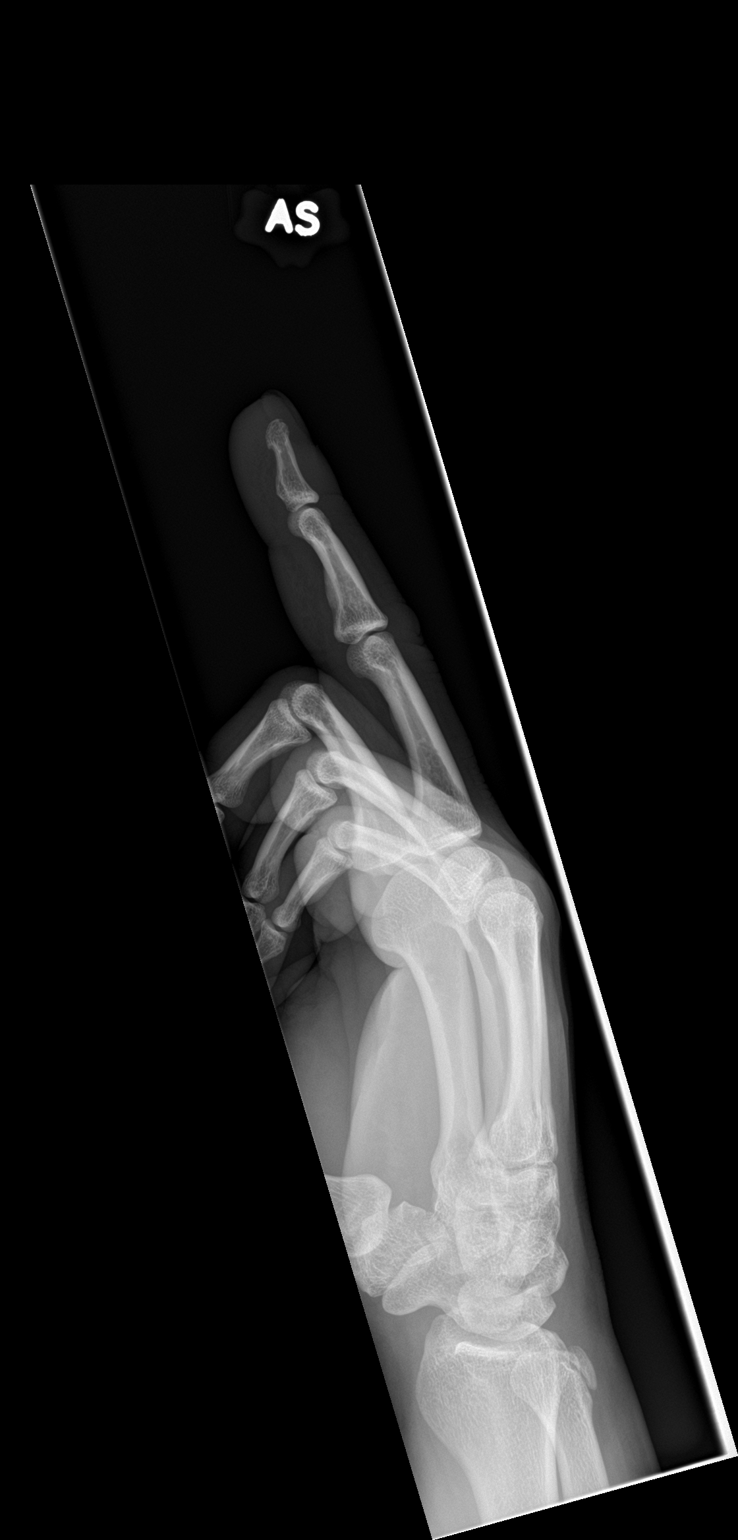

[3 of 3 positions shown; findings below may reference images not displayed]

FINDINGS: There is no evidence of fracture or dislocation. There is no
evidence of arthropathy or other focal bone abnormality. Soft
tissues are unremarkable.
IMPRESSION: Negative.
